# Patient Record
Sex: Male | Born: 1955
Health system: Southern US, Community
[De-identification: ages and names within clinical notes are randomized; demographics above are authoritative.]

## PROBLEM LIST (undated history)

## (undated) DIAGNOSIS — E785 Hyperlipidemia, unspecified: Secondary | ICD-10-CM

## (undated) DIAGNOSIS — Z720 Tobacco use: Secondary | ICD-10-CM

## (undated) DIAGNOSIS — I10 Essential (primary) hypertension: Secondary | ICD-10-CM

## (undated) DIAGNOSIS — I2510 Atherosclerotic heart disease of native coronary artery without angina pectoris: Secondary | ICD-10-CM

## (undated) HISTORY — DX: Hyperlipidemia, unspecified: E78.5

## (undated) HISTORY — DX: Essential (primary) hypertension: I10

## (undated) HISTORY — DX: Atherosclerotic heart disease of native coronary artery without angina pectoris: I25.10

## (undated) HISTORY — DX: Tobacco use: Z72.0

---

## 2006-05-31 ENCOUNTER — Ambulatory Visit (HOSPITAL_COMMUNITY): Admission: RE | Admit: 2006-05-31 | Discharge: 2006-06-01 | Payer: Self-pay | Admitting: Cardiology

## 2006-05-31 HISTORY — PX: CORONARY ANGIOPLASTY WITH STENT PLACEMENT: SHX49

## 2011-06-07 HISTORY — PX: OTHER SURGICAL HISTORY: SHX169

## 2013-01-31 ENCOUNTER — Other Ambulatory Visit (HOSPITAL_COMMUNITY): Payer: Self-pay | Admitting: Cardiovascular Disease

## 2013-01-31 DIAGNOSIS — I25810 Atherosclerosis of coronary artery bypass graft(s) without angina pectoris: Secondary | ICD-10-CM

## 2013-02-04 ENCOUNTER — Ambulatory Visit (HOSPITAL_COMMUNITY)
Admission: RE | Admit: 2013-02-04 | Discharge: 2013-02-04 | Disposition: A | Discharge status: Home / Self Care | Payer: BC Managed Care – PPO | Source: Ambulatory Visit | Attending: Cardiovascular Disease | Admitting: Cardiovascular Disease

## 2013-02-04 DIAGNOSIS — I25810 Atherosclerosis of coronary artery bypass graft(s) without angina pectoris: Secondary | ICD-10-CM | POA: Insufficient documentation

## 2013-02-04 HISTORY — PX: OTHER SURGICAL HISTORY: SHX169

## 2013-02-24 ENCOUNTER — Encounter: Payer: Self-pay | Admitting: *Deleted

## 2013-02-27 ENCOUNTER — Encounter: Payer: Self-pay | Admitting: Cardiovascular Disease

## 2013-02-28 ENCOUNTER — Ambulatory Visit (INDEPENDENT_AMBULATORY_CARE_PROVIDER_SITE_OTHER): Payer: BC Managed Care – PPO | Admitting: Cardiovascular Disease

## 2013-02-28 ENCOUNTER — Encounter: Payer: Self-pay | Admitting: Cardiovascular Disease

## 2013-02-28 VITALS — BP 118/84 | HR 64 | Ht 70.0 in | Wt 191.9 lb

## 2013-02-28 DIAGNOSIS — F17200 Nicotine dependence, unspecified, uncomplicated: Secondary | ICD-10-CM

## 2013-02-28 DIAGNOSIS — R5383 Other fatigue: Secondary | ICD-10-CM

## 2013-02-28 DIAGNOSIS — R5381 Other malaise: Secondary | ICD-10-CM

## 2013-02-28 DIAGNOSIS — I2510 Atherosclerotic heart disease of native coronary artery without angina pectoris: Secondary | ICD-10-CM

## 2013-02-28 DIAGNOSIS — I10 Essential (primary) hypertension: Secondary | ICD-10-CM

## 2013-02-28 DIAGNOSIS — E782 Mixed hyperlipidemia: Secondary | ICD-10-CM

## 2013-02-28 DIAGNOSIS — Z720 Tobacco use: Secondary | ICD-10-CM | POA: Insufficient documentation

## 2013-02-28 DIAGNOSIS — Z79899 Other long term (current) drug therapy: Secondary | ICD-10-CM

## 2013-02-28 DIAGNOSIS — E785 Hyperlipidemia, unspecified: Secondary | ICD-10-CM | POA: Insufficient documentation

## 2013-02-28 LAB — CBC
HCT: 48 % (ref 39.0–52.0)
Hemoglobin: 16.7 g/dL (ref 13.0–17.0)
MCH: 31.9 pg (ref 26.0–34.0)
MCV: 91.6 fL (ref 78.0–100.0)
Platelets: 244 10*3/uL (ref 150–400)
RBC: 5.24 MIL/uL (ref 4.22–5.81)
WBC: 6.8 10*3/uL (ref 4.0–10.5)

## 2013-02-28 LAB — LIPID PANEL
Cholesterol: 221 mg/dL — ABNORMAL HIGH (ref 0–200)
Total CHOL/HDL Ratio: 5.8 Ratio

## 2013-02-28 LAB — COMPREHENSIVE METABOLIC PANEL
ALT: 11 U/L (ref 0–53)
CO2: 25 mEq/L (ref 19–32)
Calcium: 9.4 mg/dL (ref 8.4–10.5)
Chloride: 106 mEq/L (ref 96–112)
Creat: 0.9 mg/dL (ref 0.50–1.35)
Glucose, Bld: 106 mg/dL — ABNORMAL HIGH (ref 70–99)

## 2013-02-28 MED ORDER — ATORVASTATIN CALCIUM 40 MG PO TABS: 40.0000 mg | ORAL_TABLET | Freq: Every day | ORAL | Status: DC

## 2013-02-28 NOTE — Assessment & Plan Note (Signed)
We spent over 10 minutes discussing the critical importance of smoking cessation for his future health. I have told him that we do not know enough about e-cigarettes to recommend them as an alternative, but I think it might be a useful stepping stone to complete smoking cessation. He might be able to even switch to water vapor cartridges since smoking appears to be a habit rather than a nicotine addiction his case.

## 2013-02-28 NOTE — Patient Instructions (Addendum)
Have labs done today.  Restart Atorvastatin Daily  Return in [place] year

## 2013-02-28 NOTE — Assessment & Plan Note (Signed)
He has no symptoms of active coronary insufficiency. She was able to exercise for over 11 minutes on the standardized Bruce protocol which is identical performance to September of 2012. He did not experience chest pain or show evidence of ST segment deviation while in the treadmill.  Unfortunately many of his risk factors are not currently well addressed due to noncompliance. I have reviewed the need for cholesterol-lowering medication and smoking cessation. He will have repeat labs performed today. Of note prior to initiation of statin therapy his LDL cholesterol was over 150, but while on statin therapy his LDL cholesterol was around 80.

## 2013-02-28 NOTE — Assessment & Plan Note (Signed)
Check labs today and resume atorvastatin 40 mg once daily

## 2013-02-28 NOTE — Assessment & Plan Note (Signed)
He does not truly appear to have hypotension. When relaxed his blood pressure is quite normal off medication. We will not restart either lisinopril or beta blockers. It is unclear which one of these medications was causing headaches.

## 2013-02-28 NOTE — Progress Notes (Signed)
Patient ID: Jose Bright, male   DOB: October 09, 1955, 57 y.o.   MRN: [id number]   Reason for office visit  This is Jose Bright first office visit since September 2012. He has a history of progressive angina pectoris eventually leading to cardiac catheterization in 2007. At that time he had a high-grade mid LAD stenosis dose treatment for placement of a drug-eluting stent. It is also noted to have a 20% ostial left main stenosis, a left circumflex coronary free of lesions, a 30% mid RCA stenosis and40% stenosis in a large posterior lateral ventricular branch of the right coronary artery.  He has not had a new coronary event since that time. Unfortunately he continues to smoke at least three quarters of a pack of cigarettes a day. 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  He developed headaches and stopped taking all his antihypertensive medications. The headaches resolved. His blood pressure has been normal when he checks it while relaxed and his blood pressure is normal today. He was also normal at the beginning of the stress test a few days ago, but he did have a hypertensive response to exercise.  He has also stopped taking his statin.   No Known Allergies  Current Outpatient Prescriptions  Medication Sig Dispense Refill  . nitroGLYCERIN (NITROSTAT) 0.4 MG SL tablet Place 0.4 mg under the tongue every 5 (five) minutes as needed for chest pain.      Marland Kitchen aspirin EC 81 MG tablet Take 81 mg by mouth daily. Take 2 tablets daily      . atorvastatin (LIPITOR) 40 MG tablet Take 1 tablet (40 mg total) by mouth daily.  90 tablet  3  . fish oil-omega-3 fatty acids 1000 MG capsule Take 2 g by mouth daily.       No current facility-administered medications for this visit.    Past Medical History  Diagnosis Date  . CAD (coronary artery disease)   . Systemic hypertension   . Hyperlipidemia   . Tobacco abuse     Past  Surgical History  Procedure Laterality Date  . Coronary angioplasty with stent placement  05/31/2006    drug eluting stent LAD  . Exercise treadmill test  02/04/2013    No significant ischemia  . Nuclear stress test  06/07/2011    No ischemia    No family history on file.  History   Social History  . Marital Status: Divorced    Spouse Name: N/A    Number of Children: N/A  . Years of Education: N/A   Occupational History  . Not on file.   Social History Main Topics  . Smoking status: Current Every Day Smoker -- 0.75 packs/day    Types: Cigarettes  . Smokeless tobacco: Not on file  . Alcohol Use: 3.5 - 7 oz/week    7-14 drink(s) per week  . Drug Use: No  . Sexually Active: Not on file   Other Topics Concern  . Not on file   Social History Narrative  . No narrative on file    Review of systems: The patient specifically denies any chest pain at rest exertion, dyspnea at rest or with exertion, orthopnea, paroxysmal nocturnal dyspnea, syncope, palpitations, focal neurological deficits, intermittent claudication, lower extremity edema, unexplained weight gain, cough, hemoptysis or wheezing. He denies dysuria hematuria, frequency, urgency, polyuria, polydipsia, intolerance to heat or cold, abdominal pain, change in bowel habits, gastrointestinal  bleeding, arthralgias, muscle weakness, intolerance to heat or cold, allergic reactions. He has mild erectile dysfunction and has not noticed any benefit from Viagra.  PHYSICAL EXAM BP 118/84  Pulse 64  Ht 5\' 10"  (1.778 m)  Wt 191 lb 14.4 oz (87.045 kg)  BMI 27.53 kg/m2  General: Alert, oriented x3, no distress Head: no evidence of trauma, PERRL, EOMI, no exophtalmos or lid lag, no myxedema, no xanthelasma; normal ears, nose and oropharynx Neck: normal jugular venous pulsations and no hepatojugular reflux; brisk carotid pulses without delay and no carotid bruits Chest: clear to auscultation, no signs of consolidation by percussion or  palpation, normal fremitus, symmetrical and full respiratory excursions Cardiovascular: normal position and quality of the apical impulse, regular rhythm, normal first and second heart sounds, no murmurs, rubs or gallops Abdomen: no tenderness or distention, no masses by palpation, no abnormal pulsatility or arterial bruits, normal bowel sounds, no hepatosplenomegaly Extremities: no clubbing, cyanosis or edema; 2+ radial, ulnar and brachial pulses bilaterally; 2+ right femoral, posterior tibial and dorsalis pedis pulses; 2+ left femoral, posterior tibial and dorsalis pedis pulses; no subclavian or femoral bruits Neurological: grossly nonfocal   EKG: Normal sinus rhythm normal tracing    Lipid Panel  No results found for this basename: chol, trig, hdl, cholhdl, vldl, ldlcalc    BMET No results found for this basename: na, k, cl, co2, glucose, bun, creatinine, calcium, gfrnonaa, gfraa     ASSESSMENT AND PLAN  CAD (coronary artery disease) He has no symptoms of active coronary insufficiency. She was able to exercise for over 11 minutes on the standardized Bruce protocol which is identical performance to September of 2012. He did not experience chest pain or show evidence of ST segment deviation while in the treadmill.  Unfortunately many of his risk factors are not currently well addressed due to noncompliance. I have reviewed the need for cholesterol-lowering medication and smoking cessation. He will have repeat labs performed today. Of note prior to initiation of statin therapy his LDL cholesterol was over 150, but while on statin therapy his LDL cholesterol was around 80.  Systemic hypertension He does not truly appear to have hypotension. When relaxed his blood pressure is quite normal off medication. We will not restart either lisinopril or beta blockers. It is unclear which one of these medications was causing headaches.  Hyperlipidemia Check labs today and resume atorvastatin 40 mg  once daily  Tobacco abuse We spent over 10 minutes discussing the critical importance of smoking cessation for his future health. I have told him that we do not know enough about e-cigarettes to recommend them as an alternative, but I think it might be a useful stepping stone to complete smoking cessation. He might be able to even switch to water vapor cartridges since smoking appears to be a habit rather than a nicotine addiction his case.     Junious Silk, MD, [facility] [phone] office [phone] pager

## 2014-02-11 ENCOUNTER — Encounter: Payer: Self-pay | Admitting: Cardiovascular Disease

## 2014-02-11 ENCOUNTER — Ambulatory Visit (INDEPENDENT_AMBULATORY_CARE_PROVIDER_SITE_OTHER): Payer: BC Managed Care – PPO | Admitting: Cardiovascular Disease

## 2014-02-11 VITALS — BP 120/64 | HR 66 | Resp 16 | Ht 70.5 in | Wt 193.4 lb

## 2014-02-11 DIAGNOSIS — F17200 Nicotine dependence, unspecified, uncomplicated: Secondary | ICD-10-CM

## 2014-02-11 DIAGNOSIS — Z720 Tobacco use: Secondary | ICD-10-CM

## 2014-02-11 DIAGNOSIS — Z79899 Other long term (current) drug therapy: Secondary | ICD-10-CM | POA: Diagnosis not present

## 2014-02-11 DIAGNOSIS — E785 Hyperlipidemia, unspecified: Secondary | ICD-10-CM | POA: Diagnosis not present

## 2014-02-11 DIAGNOSIS — I2510 Atherosclerotic heart disease of native coronary artery without angina pectoris: Secondary | ICD-10-CM | POA: Diagnosis not present

## 2014-02-11 DIAGNOSIS — I10 Essential (primary) hypertension: Secondary | ICD-10-CM

## 2014-02-11 LAB — COMPREHENSIVE METABOLIC PANEL
ALT: 16 U/L (ref 0–53)
AST: 15 U/L (ref 0–37)
Albumin: 4.2 g/dL (ref 3.5–5.2)
Alkaline Phosphatase: 41 U/L (ref 39–117)
BILIRUBIN TOTAL: 1 mg/dL (ref 0.2–1.2)
BUN: 13 mg/dL (ref 6–23)
CO2: 24 meq/L (ref 19–32)
Calcium: 9.2 mg/dL (ref 8.4–10.5)
Chloride: 105 mEq/L (ref 96–112)
Creat: 0.89 mg/dL (ref 0.50–1.35)
Glucose, Bld: 106 mg/dL — ABNORMAL HIGH (ref 70–99)
Potassium: 4.6 mEq/L (ref 3.5–5.3)
SODIUM: 140 meq/L (ref 135–145)
TOTAL PROTEIN: 6.1 g/dL (ref 6.0–8.3)

## 2014-02-11 LAB — LIPID PANEL
Cholesterol: 139 mg/dL (ref 0–200)
HDL: 35 mg/dL — AB (ref >39)
LDL CALC: 82 mg/dL (ref 0–99)
Total CHOL/HDL Ratio: 4 Ratio
Triglycerides: 109 mg/dL (ref <150)
VLDL: 22 mg/dL (ref 0–40)

## 2014-02-11 MED ORDER — LISINOPRIL 20 MG PO TABS: 20.0000 mg | ORAL_TABLET | Freq: Every day | ORAL | Status: DC

## 2014-02-11 NOTE — Patient Instructions (Signed)
Your physician recommends that you return for lab work in: [facility].  DOT requires a Treadmill every 2 years.  Dr. Royann Shiversroitoru recommends that you schedule a follow-up appointment in: ONE YEAR.

## 2014-02-11 NOTE — Progress Notes (Signed)
Patient ID: Jose Bright, male   DOB: Sep 20, 1956, 58 y.o.   MRN: [id number]      Reason for office visit CAD, tobacco abuse, hyperlipidemia  This is a yearlly visit. He had stopped taking his BP meds, but noticed that his SBP was around 160. He restarted lisinopril and BP is now normal. He has been compliant with statin therapy. He denies angina or dyspnea. He continues to smoke, but says he has cut back (when we counted how much he smokes, it hasn't really changed - 3/4 ppd).  He has a history of progressive angina pectoris eventually leading to cardiac catheterization in 2007. At that time he had a high-grade mid LAD stenosis dose treatment for placement of a drug-eluting stent. It is also noted to have a 20% ostial left main stenosis, a left circumflex coronary free of lesions, a 30% mid RCA stenosis and40% stenosis in a large posterior lateral ventricular branch of the right coronary artery. He has not had a new coronary event since that time. 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 He exercised for >11 minutes, just like at his nuclear scan in 2012. Both studies were normal.   No Known Allergies  Current Outpatient Prescriptions  Medication Sig Dispense Refill  . aspirin EC 81 MG tablet Take 81 mg by mouth daily. Take 2 tablets daily      . atorvastatin (LIPITOR) 40 MG tablet Take 1 tablet (40 mg total) by mouth daily.  90 tablet  3  . fish oil-omega-3 fatty acids 1000 MG capsule Take 2 g by mouth daily.      Marland Kitchen lisinopril (PRINIVIL,ZESTRIL) 20 MG tablet Take 1 tablet (20 mg total) by mouth daily.  90 tablet  3  . nitroGLYCERIN (NITROSTAT) 0.4 MG SL tablet Place 0.4 mg under the tongue every 5 (five) minutes as needed for chest pain.       No current facility-administered medications for this visit.    Past Medical History  Diagnosis Date  . CAD (coronary artery disease)   . Systemic hypertension   . Hyperlipidemia   .  Tobacco abuse     Past Surgical History  Procedure Laterality Date  . Coronary angioplasty with stent placement  05/31/2006    drug eluting stent LAD  . Exercise treadmill test  02/04/2013    No significant ischemia  . Nuclear stress test  06/07/2011    No ischemia    No family history on file.  History   Social History  . Marital Status: Divorced    Spouse Name: N/A    Number of Children: N/A  . Years of Education: N/A   Occupational History  . Not on file.   Social History Main Topics  . Smoking status: Current Every Day Smoker -- 0.75 packs/day    Types: Cigarettes  . Smokeless tobacco: Not on file  . Alcohol Use: 3.5 - 7.0 oz/week    7-14 drink(s) per week  . Drug Use: No  . Sexual Activity: Not on file   Other Topics Concern  . Not on file   Social History Narrative  . No narrative on file    Review of systems: The patient specifically denies any chest pain at rest or with exertion, dyspnea at rest or with exertion, orthopnea, paroxysmal nocturnal dyspnea, syncope, palpitations, focal neurological deficits, intermittent claudication, lower extremity edema, unexplained weight gain, cough, hemoptysis or wheezing.  The patient also  denies abdominal pain, nausea, vomiting, dysphagia, diarrhea, constipation, polyuria, polydipsia, dysuria, hematuria, frequency, urgency, abnormal bleeding or bruising, fever, chills, unexpected weight changes, mood swings, change in skin or hair texture, change in voice quality, auditory or visual problems, allergic reactions or rashes, new musculoskeletal complaints other than usual "aches and pains".  PHYSICAL EXAM BP 120/64  Pulse 66  Resp 16  Ht 5' 10.5" (1.791 m)  Wt 193 lb 6.4 oz (87.726 kg)  BMI 27.35 kg/m2  General: Alert, oriented x3, no distress Head: no evidence of trauma, PERRL, EOMI, no exophtalmos or lid lag, no myxedema, no xanthelasma; normal ears, nose and oropharynx Neck: normal jugular venous pulsations and no  hepatojugular reflux; brisk carotid pulses without delay and no carotid bruits Chest: clear to auscultation, no signs of consolidation by percussion or palpation, normal fremitus, symmetrical and full respiratory excursions Cardiovascular: normal position and quality of the apical impulse, regular rhythm, normal first and second heart sounds, no murmurs, rubs or gallops Abdomen: no tenderness or distention, no masses by palpation, no abnormal pulsatility or arterial bruits, normal bowel sounds, no hepatosplenomegaly Extremities: no clubbing, cyanosis or edema; 2+ radial, ulnar and brachial pulses bilaterally; 2+ right femoral, posterior tibial and dorsalis pedis pulses; 2+ left femoral, posterior tibial and dorsalis pedis pulses; no subclavian or femoral bruits Neurological: grossly nonfocal  EKG: NSR, normal  Lipid Panel     Component Value Date/Time   CHOL 139 02/11/2014 0905   TRIG 109 02/11/2014 0905   HDL 35* 02/11/2014 0905   CHOLHDL 4.0 02/11/2014 0905   VLDL 22 02/11/2014 0905   LDLCALC 82 02/11/2014 0905    BMET    Component Value Date/Time   NA 140 02/11/2014 0905   K 4.6 02/11/2014 0905   CL 105 02/11/2014 0905   CO2 24 02/11/2014 0905   GLUCOSE 106* 02/11/2014 0905   BUN 13 02/11/2014 0905   CREATININE 0.89 02/11/2014 0905   CALCIUM 9.2 02/11/2014 0905     ASSESSMENT AND PLAN  CAD (coronary artery disease)  He has no symptoms of active coronary insufficiency. H was able to exercise for over 11 minutes on the standardized Bruce protocol in 2014, identicalal performance to September of 2012.  Systemic hypertension  Back on lisinopril (which he had blamed for headaches in the past), BP excellent.   Hyperlipidemia  Checked labs today on atorvastatin 40 mg once daily. Satisfactory LDL. Ideally would be <70, but do not think adding Zetia is likely to make a difference. Reviewed a cardiac healthy diet. Exercise will help HDL.   Tobacco abuse  We spent over 10 minutes discussing  the critical importance of smoking cessation for his future health - went over exactly the same issues as last year. I have told him that we do not know enough about e-cigarettes to recommend them as an alternative, but I think it might be a useful stepping stone to complete smoking cessation. He might be able to even switch to water vapor cartridges since smoking appears to be a habit rather than a nicotine addiction his case.   Orders Placed This Encounter  Procedures  . Lipid Profile  . Comp Met (CMET)  . EKG 12-Lead   Meds ordered this encounter  Medications  . DISCONTD: lisinopril (PRINIVIL,ZESTRIL) 20 MG tablet    Sig: Take 20 mg by mouth daily.  Marland Kitchen lisinopril (PRINIVIL,ZESTRIL) 20 MG tablet    Sig: Take 1 tablet (20 mg total) by mouth daily.    Dispense:  90 tablet  Refill:  3    Monte Zinni  Sanda Klein, MD, [facility] [phone] office [phone] pager

## 2014-05-17 ENCOUNTER — Other Ambulatory Visit: Payer: Self-pay | Admitting: Cardiovascular Disease

## 2014-05-19 NOTE — Telephone Encounter (Signed)
Rx refill denied to patient pharmacy, Rx was filled 5/29

## 2014-06-17 ENCOUNTER — Other Ambulatory Visit: Payer: Self-pay | Admitting: Cardiovascular Disease

## 2014-06-18 NOTE — Telephone Encounter (Signed)
Rx was sent to pharmacy electronically. 

## 2014-09-14 ENCOUNTER — Ambulatory Visit (INDEPENDENT_AMBULATORY_CARE_PROVIDER_SITE_OTHER): Payer: BC Managed Care – PPO | Admitting: Family Medicine

## 2014-09-14 VITALS — BP 132/72 | HR 74 | Temp 97.6°F | Resp 16 | Ht 71.0 in | Wt 185.0 lb

## 2014-09-14 DIAGNOSIS — J0100 Acute maxillary sinusitis, unspecified: Secondary | ICD-10-CM

## 2014-09-14 MED ORDER — AMOXICILLIN-POT CLAVULANATE 875-125 MG PO TABS: 1.0000 | ORAL_TABLET | Freq: Two times a day (BID) | ORAL | Status: DC

## 2014-09-14 MED ORDER — IPRATROPIUM BROMIDE 0.02 % IN SOLN
0.5000 mg | Freq: Once | RESPIRATORY_TRACT | Status: AC
  Administered 2014-09-14: 0.5 mg via RESPIRATORY_TRACT

## 2014-09-14 MED ORDER — HYDROCODONE-ACETAMINOPHEN 7.5-325 MG/15ML PO SOLN: 5.0000 mL | ORAL | Status: DC | PRN

## 2014-09-14 MED ORDER — ALBUTEROL SULFATE (2.5 MG/3ML) 0.083% IN NEBU
2.5000 mg | INHALATION_SOLUTION | Freq: Once | RESPIRATORY_TRACT | Status: AC
  Administered 2014-09-14: 2.5 mg via RESPIRATORY_TRACT

## 2014-09-14 MED ORDER — PREDNISONE 20 MG PO TABS: ORAL_TABLET | ORAL | Status: DC

## 2014-09-14 MED ORDER — MUPIROCIN 2 % EX OINT: 1.0000 "application " | TOPICAL_OINTMENT | Freq: Three times a day (TID) | CUTANEOUS | Status: DC

## 2014-09-14 NOTE — Progress Notes (Signed)
Subjective:  This chart was scribed for Norberto SorensonEva Kymoni Monday, MD by Haywood PaoNadim Abu Hashem, ED Scribe at [facility].The patient was seen in exam room 14 and the patient's care was started at 3:38 PM.   Patient ID: Jose Bright, male    DOB: 10/04/1955, 58 y.o.   MRN: [id number] Chief Complaint  Patient presents with  . Sinusitis   HPI  HPI Comments: Jose CapesDwight Bright is a 58 y.o. male who [facility] complaining [facility] HA, sinus infection onset 3 weeks ago. Pt has HA's primarily on the right side of his head at night . The pain radiates to his right ear and jaw. Pt has clear nasal discharge and trouble sleeping as associated symptoms. He has a productive cough but states this is because of smoking.  Pt was treated two years ago march for a sinus infection and he states he had similar complaints.  Past Medical History  Diagnosis Date  . CAD (coronary artery disease)   . Systemic hypertension   . Hyperlipidemia   . Tobacco abuse    Current Outpatient Prescriptions on File Prior to Visit  Medication Sig Dispense Refill  . aspirin EC 81 MG tablet Take 81 mg by mouth daily. Take 2 tablets daily    . atorvastatin (LIPITOR) 40 MG tablet TAKE 1 TABLET (40 MG TOTAL) BY MOUTH DAILY. 90 tablet 2  . nitroGLYCERIN (NITROSTAT) 0.4 MG SL tablet Place 0.4 mg under the tongue every 5 (five) minutes as needed for chest pain.    . fish oil-omega-3 fatty acids 1000 MG capsule Take 2 g by mouth daily.    Marland Kitchen. lisinopril (PRINIVIL,ZESTRIL) 20 MG tablet Take 1 tablet (20 mg total) by mouth daily. (Patient not taking: Reported on 09/14/2014) 90 tablet 3   No current facility-administered medications on file prior to visit.  No Known Allergies  Review of Systems  HENT: Positive for congestion, ear pain, rhinorrhea and sinus pressure.   Respiratory: Positive for cough.   Neurological: Positive for headaches.  Psychiatric/Behavioral: Positive for sleep disturbance.      Objective:  BP 132/72 mmHg  Pulse  74  Temp(Src) 97.6 F (36.4 C) (Oral)  Resp 16  Ht 5\' 11"  (1.803 m)  Wt 185 lb (83.915 kg)  BMI 25.81 kg/m2  SpO2 97%  Physical Exam  Constitutional: He is oriented to person, place, and time. He appears well-developed and well-nourished.  HENT:  Head: Normocephalic and atraumatic.  Mouth/Throat: Mucous membranes are normal. Posterior oropharyngeal erythema present. No oropharyngeal exudate or posterior oropharyngeal edema.  Cerumen impaction bilaterally but left is more clear than right.  Eyes: EOM are normal.  Neck: Normal range of motion.  Cardiovascular: Normal rate, regular rhythm, S1 normal and normal heart sounds.   No murmur heard. Pulmonary/Chest: Effort normal.  Left lower lobe rhonchi on expiration and inspiration. Right lower lobe rhonchi on inspiration but cleared with prolonged expiration.    Musculoskeletal: Normal range of motion.  Lymphadenopathy:       Head (right side): Tonsillar adenopathy present.       Head (left side): Tonsillar adenopathy present.    He has no cervical adenopathy.       Right: No supraclavicular adenopathy present.       Left: No supraclavicular adenopathy present.  Neurological: He is alert and oriented to person, place, and time.  Skin: Skin is warm and dry.  Psychiatric: He has a normal mood and affect. His behavior is normal.  Nursing note and  vitals reviewed.     Assessment & Plan:   Acute maxillary sinusitis, recurrence not specified - Plan: albuterol (PROVENTIL) (2.5 MG/3ML) 0.083% nebulizer solution 2.5 mg, ipratropium (ATROVENT) nebulizer solution 0.5 mg  Meds ordered this encounter  Medications  . DISCONTD: amoxicillin-clavulanate (AUGMENTIN) 875-125 MG per tablet    Sig: Take 1 tablet by mouth 2 (two) times daily.    Dispense:  28 tablet    Refill:  0  . DISCONTD: predniSONE (DELTASONE) 20 MG tablet    Sig: Take 3 tabs po qd x 3d, then 2 tabs po qd x 3d, then 1 tab po qd x 3d, then stop    Dispense:  18 tablet     Refill:  0  . albuterol (PROVENTIL) (2.5 MG/3ML) 0.083% nebulizer solution 2.5 mg    Sig:   . ipratropium (ATROVENT) nebulizer solution 0.5 mg    Sig:   . mupirocin ointment (BACTROBAN) 2 %    Sig: Apply 1 application topically 3 (three) times daily.    Dispense:  30 g    Refill:  1  . HYDROcodone-acetaminophen (HYCET) 7.5-325 mg/15 ml solution    Sig: Take 5-10 mLs by mouth every 4 (four) hours as needed for moderate pain.    Dispense:  140 mL    Refill:  0    I personally performed the services described in this documentation, which was scribed in my presence. The recorded information has been reviewed and considered, and addended by me as needed.  Norberto SorensonEva Rhodia [place], MD MPH

## 2014-09-14 NOTE — Patient Instructions (Signed)
Hot showers or breathing in steam may help loosen the congestion.  Using a netti pot or sinus rinse is also likely to help you feel better and keep this from progressing.   I recommend augmenting with generic mucinex to help you move out the congestion.  If no improvement or you are getting worse, come back but hopefully with all of the above, you can avoid it.   Sinusitis Sinusitis is redness, soreness, and inflammation of the paranasal sinuses. Paranasal sinuses are air pockets within the bones of your face (beneath the eyes, the middle of the forehead, or above the eyes). In healthy paranasal sinuses, mucus is able to drain out, and air is able to circulate through them by way of your nose. However, when your paranasal sinuses are inflamed, mucus and air can become trapped. This can allow bacteria and other germs to grow and cause infection. Sinusitis can develop quickly and last only a short time (acute) or continue over a long period (chronic). Sinusitis that lasts for more than 12 weeks is considered chronic.  CAUSES  Causes of sinusitis include:  Allergies.  Structural abnormalities, such as displacement of the cartilage that separates your nostrils (deviated septum), which can decrease the air flow through your nose and sinuses and affect sinus drainage.  Functional abnormalities, such as when the small hairs (cilia) that line your sinuses and help remove mucus do not work properly or are not present. SIGNS AND SYMPTOMS  Symptoms of acute and chronic sinusitis are the same. The primary symptoms are pain and pressure around the affected sinuses. Other symptoms include:  Upper toothache.  Earache.  Headache.  Bad breath.  Decreased sense of smell and taste.  A cough, which worsens when you are lying flat.  Fatigue.  Fever.  Thick drainage from your nose, which often is green and may contain pus (purulent).  Swelling and warmth over the affected sinuses. DIAGNOSIS  Your  health care provider will perform a physical exam. During the exam, your health care provider may:  Look in your nose for signs of abnormal growths in your nostrils (nasal polyps).  Tap over the affected sinus to check for signs of infection.  View the inside of your sinuses (endoscopy) using an imaging device that has a light attached (endoscope). If your health care provider suspects that you have chronic sinusitis, one or more of the following tests may be recommended:  Allergy tests.  Nasal culture. A sample of mucus is taken from your nose, sent to a lab, and screened for bacteria.  Nasal cytology. A sample of mucus is taken from your nose and examined by your health care provider to determine if your sinusitis is related to an allergy. TREATMENT  Most cases of acute sinusitis are related to a viral infection and will resolve on their own within 10 days. Sometimes medicines are prescribed to help relieve symptoms (pain medicine, decongestants, nasal steroid sprays, or saline sprays).  However, for sinusitis related to a bacterial infection, your health care provider will prescribe antibiotic medicines. These are medicines that will help kill the bacteria causing the infection.  Rarely, sinusitis is caused by a fungal infection. In theses cases, your health care provider will prescribe antifungal medicine. For some cases of chronic sinusitis, surgery is needed. Generally, these are cases in which sinusitis recurs more than 3 times per year, despite other treatments. HOME CARE INSTRUCTIONS   Drink plenty of water. Water helps thin the mucus so your sinuses can drain more   easily.  Use a humidifier.  Inhale steam 3 to 4 times a day (for example, sit in the bathroom with the shower running).  Apply a warm, moist washcloth to your face 3 to 4 times a day, or as directed by your health care provider.  Use saline nasal sprays to help moisten and clean your sinuses.  Take medicines only as  directed by your health care provider.  If you were prescribed either an antibiotic or antifungal medicine, finish it all even if you start to feel better. SEEK IMMEDIATE MEDICAL CARE IF:  You have increasing pain or severe headaches.  You have nausea, vomiting, or drowsiness.  You have swelling around your face.  You have vision problems.  You have a stiff neck.  You have difficulty breathing. MAKE SURE YOU:   Understand these instructions.  Will watch your condition.  Will get help right away if you are not doing well or get worse. Document Released: 09/19/2005 Document Revised: 02/03/2014 Document Reviewed: 10/04/2011 Exit[employer] Patient Information 2015 [employer]. This information is not intended to replace advice given to you by your health care provider. Make sure you discuss any questions you have with your health care provider.  

## 2014-09-27 ENCOUNTER — Other Ambulatory Visit: Payer: Self-pay | Admitting: Family Medicine

## 2014-09-28 ENCOUNTER — Telehealth: Payer: Self-pay

## 2014-09-28 NOTE — Telephone Encounter (Signed)
Pt called in stating that he needed a refill on his amoxicillin-clavulanate (AUGMENTIN) 875-125 MG per tablet. He uses the [facility] in [place], [place] - [address]  His call back number is [phone]

## 2014-09-29 ENCOUNTER — Ambulatory Visit (INDEPENDENT_AMBULATORY_CARE_PROVIDER_SITE_OTHER): Payer: BC Managed Care – PPO | Admitting: Family Medicine

## 2014-09-29 VITALS — BP 140/90 | HR 60 | Temp 98.0°F | Resp 16 | Ht 70.5 in | Wt 185.0 lb

## 2014-09-29 DIAGNOSIS — J0111 Acute recurrent frontal sinusitis: Secondary | ICD-10-CM

## 2014-09-29 DIAGNOSIS — J3089 Other allergic rhinitis: Secondary | ICD-10-CM

## 2014-09-29 MED ORDER — AMOXICILLIN-POT CLAVULANATE 875-125 MG PO TABS: 1.0000 | ORAL_TABLET | Freq: Two times a day (BID) | ORAL | Status: DC

## 2014-09-29 MED ORDER — PREDNISONE 20 MG PO TABS: ORAL_TABLET | ORAL | Status: DC

## 2014-09-29 NOTE — Patient Instructions (Signed)

## 2014-09-29 NOTE — Progress Notes (Signed)
Subjective:    Patient ID: Jose Bright, male    DOB: 11/28/1955, 58 y.o.   MRN: [id number] This chart was scribed for Ethelda ChickKristi M Ruqaya Strauss, MD by Leona CarryG. Clay Sherrill, ED Scribe. The patient was seen in 8. The patient's care was started at 7:38 PM.   09/29/2014  Sinus Problem  HPI  HPI Comments: Jose Bright is a 58 y.o. male who presents today for a two week follow up for sinusitis. He reports continued rhinorrhea with clear drainage. Patient states that his cough is still present but has improved in the last week. He was prescribed Augmentin and Prednisone and states that he has taken them as directed. Patient has also taken OTC Flonase with some relief of symptoms. Patient reports some improvement beginning several days before Christmas. He reports less frequent headaches and reduced sinus pain and burning. Patient reports that his headaches can be so severe that they cause his eyes to water and nose to run. He denies fever, chills, sore throat, otalgia, or SOB in the last week.  He has been using Bactroban mixed in saline to spray into nose per recommendation of pharmacist.  Similar sinus symptoms several years ago; underwent ENT consultation at that time due to severity of headaches; diagnosed with sinus infection at that time.   Symptoms are 75% improved but feels that warrants additional medication to completely treat symptoms; Dr. Clelia CroftShaw prescribed two weeks of antibiotics.  Patient does not have a PCP other than [facility]. Patient has a history of smoking and states that he smokes less than one pack per day.   Review of Systems  Constitutional: Negative for fever, chills, diaphoresis and fatigue.  HENT: Positive for congestion, postnasal drip, rhinorrhea and sinus pressure. Negative for dental problem, ear discharge, ear pain, sneezing, sore throat, trouble swallowing and voice change.   Respiratory: Positive for cough. Negative for shortness of breath.   Gastrointestinal: Negative for nausea,  vomiting and diarrhea.  Skin: Negative for rash.  Neurological: Positive for headaches. Negative for dizziness, tremors, seizures, syncope, speech difficulty, light-headedness and numbness.    Past Medical History  Diagnosis Date  . CAD (coronary artery disease)   . Systemic hypertension   . Hyperlipidemia   . Tobacco abuse    Past Surgical History  Procedure Laterality Date  . Coronary angioplasty with stent placement  05/31/2006    drug eluting stent LAD  . Exercise treadmill test  02/04/2013    No significant ischemia  . Nuclear stress test  06/07/2011    No ischemia   No Known Allergies Current Outpatient Prescriptions  Medication Sig Dispense Refill  . aspirin EC 81 MG tablet Take 81 mg by mouth daily. Take 2 tablets daily    . atorvastatin (LIPITOR) 40 MG tablet TAKE 1 TABLET (40 MG TOTAL) BY MOUTH DAILY. 90 tablet 2  . fish oil-omega-3 fatty acids 1000 MG capsule Take 2 g by mouth daily.    Marland Kitchen. HYDROcodone-acetaminophen (HYCET) 7.5-325 mg/15 ml solution Take 5-10 mLs by mouth every 4 (four) hours as needed for moderate pain. 140 mL 0  . lisinopril (PRINIVIL,ZESTRIL) 20 MG tablet Take 1 tablet (20 mg total) by mouth daily. 90 tablet 3  . mupirocin ointment (BACTROBAN) 2 % Apply 1 application topically 3 (three) times daily. 30 g 1  . nitroGLYCERIN (NITROSTAT) 0.4 MG SL tablet Place 0.4 mg under the tongue every 5 (five) minutes as needed for chest pain.    . predniSONE (DELTASONE) 20 MG tablet 2 tabs po  qd x 3d, then 1 tab po qd x 3d, then stop 9 tablet 0  . amoxicillin-clavulanate (AUGMENTIN) 875-125 MG per tablet Take 1 tablet by mouth 2 (two) times daily. 20 tablet 0   No current facility-administered medications for this visit.       Objective:    Triage Vitals: BP 140/90 mmHg  Pulse 60  Temp(Src) 98 F (36.7 C) (Oral)  Resp 16  Ht 5' 10.5" (1.791 m)  Wt 185 lb (83.915 kg)  BMI 26.16 kg/m2  SpO2 99% Physical Exam  Constitutional: He is oriented to person, place,  and time. He appears well-developed and well-nourished. No distress.  HENT:  Head: Normocephalic and atraumatic.  Right Ear: External ear normal.  Left Ear: External ear normal.  Nose: Mucosal edema and rhinorrhea present. No sinus tenderness or nasal deformity. Right sinus exhibits maxillary sinus tenderness and frontal sinus tenderness. Left sinus exhibits no maxillary sinus tenderness and no frontal sinus tenderness.  Mouth/Throat: No oropharyngeal exudate.  Diffuse erythema in bilateral nares. Drainage in oropharynx.  Eyes: Conjunctivae and EOM are normal.  Neck: Neck supple. No tracheal deviation present. No thyromegaly present.  Cardiovascular: Normal rate, regular rhythm and normal heart sounds.   No murmur heard. Pulmonary/Chest: Effort normal. No respiratory distress. He has wheezes. He has no rales.  Scant expiratory wheezes throughout.  Musculoskeletal: Normal range of motion.  Lymphadenopathy:    He has no cervical adenopathy.  Neurological: He is alert and oriented to person, place, and time.  Skin: Skin is warm and dry. He is not diaphoretic.  Psychiatric: He has a normal mood and affect. His behavior is normal.  Nursing note and vitals reviewed.       Assessment & Plan:   1. Acute recurrent frontal sinusitis   2. Other allergic rhinitis      1. Acute frontal sinusitis: improving but persistent; refill of Augmentin provided. Also refill of Prednisone provided due to severity of symptoms with initial presentation. 2.  Allergic Rhinitis: uncontrolled; recommend pt take daily Flonase; rx for Prednisone provided.    Meds ordered this encounter  Medications  . amoxicillin-clavulanate (AUGMENTIN) 875-125 MG per tablet    Sig: Take 1 tablet by mouth 2 (two) times daily.    Dispense:  20 tablet    Refill:  0  . predniSONE (DELTASONE) 20 MG tablet    Sig: 2 tabs po qd x 3d, then 1 tab po qd x 3d, then stop    Dispense:  9 tablet    Refill:  0    No Follow-up on  file.   I personally performed the services described in this documentation, which was scribed in my presence. The recorded information has been reviewed and considered.    Nilda SimmerKristi Pearse Shiffler, M.D.  [facility]  [facility] [address] [place], [place]  [id number] [phone] phone [phone] fax

## 2014-09-30 NOTE — Telephone Encounter (Signed)
Pt needs to RTC- we will not refill abx prescriptions without a re evaluation.

## 2014-09-30 NOTE — Telephone Encounter (Signed)
Pt was seen on 12/27 and refill was sent to the pharmacy.

## 2014-10-19 ENCOUNTER — Ambulatory Visit (HOSPITAL_COMMUNITY): Admission: RE | Admit: 2014-10-19 | Payer: Self-pay | Source: Ambulatory Visit

## 2014-10-19 ENCOUNTER — Telehealth: Payer: Self-pay | Admitting: *Deleted

## 2014-10-19 ENCOUNTER — Ambulatory Visit (INDEPENDENT_AMBULATORY_CARE_PROVIDER_SITE_OTHER): Payer: BLUE CROSS/BLUE SHIELD | Admitting: Family Medicine

## 2014-10-19 VITALS — BP 128/74 | HR 70 | Temp 97.9°F | Resp 19 | Ht 70.0 in | Wt 185.8 lb

## 2014-10-19 DIAGNOSIS — J329 Chronic sinusitis, unspecified: Secondary | ICD-10-CM

## 2014-10-19 DIAGNOSIS — H04201 Unspecified epiphora, right lacrimal gland: Secondary | ICD-10-CM

## 2014-10-19 DIAGNOSIS — R51 Headache: Secondary | ICD-10-CM

## 2014-10-19 DIAGNOSIS — J3489 Other specified disorders of nose and nasal sinuses: Secondary | ICD-10-CM

## 2014-10-19 DIAGNOSIS — R519 Headache, unspecified: Secondary | ICD-10-CM

## 2014-10-19 LAB — POCT SEDIMENTATION RATE: POCT SED RATE: 21 mm/h (ref 0–22)

## 2014-10-19 NOTE — Progress Notes (Addendum)
Subjective:    Patient ID: Jose Bright, male    DOB: [birth year], 59 y.o.   MRN: [id number] This chart was scribed for Merri Ray, MD by Marti Sleigh, Medical Scribe. This patient was seen in Room 2 and the patient's care was started a 3:10 PM.  Chief Complaint  Patient presents with  . Follow-up    patient has been here twice for sinus, getting better but has not gone away  . Headache    right side headache  . Nasal Congestion    HPI HPI Comments: Jose Bright is a 59 y.o. male with a hx of CAD, HTN and HLD who presents to [facility] reporting for a follow up appointment due to recurrent sinus infection over the last two months. Pt states he has improved but endorses persistent sharp and burning HA on right side, with associated soreness on the right side of face radiating across his scalp with associated eye watering. Pt states he had similar sx two years ago. Pt states he is blowing out green mucous, with some blood for the last week. Pt states that he has been using a Q-tip to manually open his sinuses, and apply Bactroban. After he used the q-tip he noticed blood in his mucous. Pt states he was addicted to afrin nasal spray in the past, but has not used it in the couple days. Pt states that preceding two days ago he used it "occationally, but not other day." Pt states he has not have any visual disturbance with his sx. Pt saw an ENT doctor at [facility] two years ago for similar sx. Pt is a current every day smoker.  Pt was initially seen by Dr. Brigitte Pulse December 17th with HA and sinus congestion ongoing for the last three weeks. Had clear nasal discharge with pain radiating to right ear and jaw three weeks. Pt was treated with Augmentin, Prednisone, Atrovent and albuterol nebulizer. Pt was seen in follow up by Dr. Tamala Julian on [facility]. Pt had continued rhinorrhea with clear drainage and improved but still present cough. Pt had also been using Bactroban mixed with saline nasal spray. Pt  was treated for acute, recurrent sinusitis. Restarted on Augmentin 875 BID for ten days and prednisone, 6 day taper.   Pt has a hx of CAD most recent bruse protocol stress test, May 2014, 11 minutes. Cardiac catheterization with stent placement in the LAD.    Patient Active Problem List   Diagnosis Date Noted  . CAD (coronary artery disease) 02/28/2013  . Tobacco abuse   . Hyperlipidemia   . Systemic hypertension    Past Medical History  Diagnosis Date  . CAD (coronary artery disease)   . Systemic hypertension   . Hyperlipidemia   . Tobacco abuse    Past Surgical History  Procedure Laterality Date  . Coronary angioplasty with stent placement  05/31/2006    drug eluting stent LAD  . Exercise treadmill test  02/04/2013    No significant ischemia  . Nuclear stress test  06/07/2011    No ischemia   No Known Allergies Prior to Admission medications   Medication Sig Start Date End Date Taking? Authorizing Provider  aspirin EC 81 MG tablet Take 81 mg by mouth daily. Take 2 tablets daily   Yes Historical Provider, MD  atorvastatin (LIPITOR) 40 MG tablet TAKE 1 TABLET (40 MG TOTAL) BY MOUTH DAILY. 06/18/14  Yes Mihai Croitoru, MD  fish oil-omega-3 fatty acids 1000 MG capsule Take 2 g by  mouth daily.   Yes Historical Provider, MD  lisinopril (PRINIVIL,ZESTRIL) 20 MG tablet Take 1 tablet (20 mg total) by mouth daily. 02/11/14  Yes Mihai Croitoru, MD  mupirocin ointment (BACTROBAN) 2 % Apply 1 application topically 3 (three) times daily. 09/14/14  Yes Shawnee Knapp, MD  nitroGLYCERIN (NITROSTAT) 0.4 MG SL tablet Place 0.4 mg under the tongue every 5 (five) minutes as needed for chest pain.   Yes Historical Provider, MD  amoxicillin-clavulanate (AUGMENTIN) 875-125 MG per tablet Take 1 tablet by mouth 2 (two) times daily. Patient not taking: Reported on 10/19/2014 09/29/14   Wardell Honour, MD  HYDROcodone-acetaminophen (HYCET) 7.5-325 mg/15 ml solution Take 5-10 mLs by mouth every 4 (four) hours as  needed for moderate pain. 09/14/14   Shawnee Knapp, MD  predniSONE (DELTASONE) 20 MG tablet 2 tabs po qd x 3d, then 1 tab po qd x 3d, then stop Patient not taking: Reported on 10/19/2014 09/29/14   Wardell Honour, MD   History   Social History  . Marital Status: Divorced    Spouse Name: N/A    Number of Children: N/A  . Years of Education: N/A   Occupational History  . Not on file.   Social History Main Topics  . Smoking status: Current Every Day Smoker -- 0.75 packs/day    Types: Cigarettes  . Smokeless tobacco: Former Systems developer  . Alcohol Use: 4.2 - 8.4 oz/week    7-14 Not specified per week  . Drug Use: No  . Sexual Activity: Not on file   Other Topics Concern  . Not on file   Social History Narrative    Review of Systems  Constitutional: Negative for fever and chills.  HENT: Positive for congestion, nosebleeds, rhinorrhea and sinus pressure.   Eyes: Negative for visual disturbance.  Neurological: Positive for headaches.  Psychiatric/Behavioral: Positive for sleep disturbance.       Objective:   Physical Exam  Constitutional: He is oriented to person, place, and time. He appears well-developed and well-nourished.  HENT:  Head: Normocephalic and atraumatic.  Right Ear: Tympanic membrane, external ear and ear canal normal.  Left Ear: Tympanic membrane, external ear and ear canal normal.  Nose: No rhinorrhea.  Mouth/Throat: Oropharynx is clear and moist and mucous membranes are normal. No oropharyngeal exudate or posterior oropharyngeal erythema.  Mucosal irritation along septum, without obvious perpheration  . Unable to to see posterior aspect fo septum. No discharge or bleeding. Frontal and maxillary sinuses are non tender. Tender over the right temporal scalp without any firm nodules palpated.   Eyes: Conjunctivae are normal. Pupils are equal, round, and reactive to light.  Neck: Neck supple.  Cardiovascular: Normal rate, regular rhythm, normal heart sounds and intact  distal pulses.   No murmur heard. Pulmonary/Chest: Effort normal and breath sounds normal. No respiratory distress. He has no wheezes. He has no rhonchi. He has no rales.  Abdominal: Soft. There is no tenderness.  Lymphadenopathy:    He has no cervical adenopathy.  Neurological: He is alert and oriented to person, place, and time.  Skin: Skin is warm and dry. No rash noted.  Psychiatric: He has a normal mood and affect. His behavior is normal.  Nursing note and vitals reviewed.   Filed Vitals:   10/19/14 1453  BP: 128/74  Pulse: 70  Temp: 97.9 F (36.6 C)  TempSrc: Oral  Resp: 19  Height: _0  (1.778 m)  Weight: 185 lb 12.8 oz (84.278 kg)  SpO2: 99%  Results for orders placed or performed in visit on 10/19/14  POCT SEDIMENTATION RATE  Result Value Ref Range   POCT SED RATE 21 0 - 22 mm/hr        Assessment & Plan:  Jose Bright is a 59 y.o. male Right sided temporal headache - Plan: POCT SEDIMENTATION RATE, CT MAXILLOFACIAL LTD WO CM, Ambulatory referral to ENT, CANCELED: CT Maxillofacial LTD WO CM, CANCELED: POCT SEDIMENTATION RATE  Chronic sinusitis, unspecified location - Plan: Ambulatory referral to ENT  Rhinorrhea - Plan: Ambulatory referral to ENT  Watering of right eye  Recurrent possible sinusitis now s/p 2 rounds of Augmentin and prednisone. Symptomatically has improved with sinus symptoms, but still with R sided headaches intermittently.  With associated rhinorrhea and lacrimation, DDX includes cluster headaches. Initially concerned with temporal arteritis, but ESR normal.   -Recommended CT of sinuses to determine if infection present prior to another course of antibiotics.  Declined tonight, but advised to call me this week to order CT if amenable at that time.  We will check into cost for him.   -saline NS, tylenol if needed, avoid further Qtips/instrumentation of nasal passages.  -refer to ENT.   Consider neuro eval for cluster HA and possible  neuroimaging if HA's persist. I can do this without OV as well if he would like.   -RTC precautions.   No orders of the defined types were placed in this encounter.   Patient Instructions  I will refer you back to [facility].  If sinus pressure, pain or discharge not continuing to improve this week with use of saline nasal spray - I recommend we obtain a CT scan of the sinuses as was recommended today.  I can order this anytime this week without an office visit - let me know. Avoid any further qtips inside of nose.   I will let you know about the results of the inflammation test tonight.  If it is elevated, we will discuss other causes of headache and workup needed. If any vision changes - go to emergency room.   Your symptoms may also be due to cluster headaches as these can present with one sided headache, runny nose and burning/watering eyes. If headaches are not improving as sinuses improve - I can refer you to a neurologist or headache specialist to treat and evaluate this, as you may need other testing such as a head cat scan.  If you do have an acute attack of the headache - can try oxygen as treatment here or in emergency room acutely. Sometimes medicine like sumatriptan (also used for migraines) can treat cluster headaches, but with history of heart disease, would want clearance from cardiologist prior to prescribing this medicine.   For now - tylenol as needed, saline nasal spray, ok to continue flonase, and if not improving this week - let me know as above.    Return to the clinic or go to the nearest emergency room if any of your symptoms worsen or new symptoms occur.    \

## 2014-10-19 NOTE — Telephone Encounter (Signed)
Dr. Neva SeatGreene would like for us to see if we can figure out how much a CT limited sinus would be for this pt.  If we can let him know and then order if pt decides he would like to do it.

## 2014-10-19 NOTE — Patient Instructions (Addendum)
I will refer you back to [facility].  If sinus pressure, pain or discharge not continuing to improve this week with use of saline nasal spray - I recommend we obtain a CT scan of the sinuses as was recommended today.  I can order this anytime this week without an office visit - let me know. Avoid any further qtips inside of nose.   I will let you know about the results of the inflammation test tonight.  If it is elevated, we will discuss other causes of headache and workup needed. If any vision changes - go to emergency room.   Your symptoms may also be due to cluster headaches as these can present with one sided headache, runny nose and burning/watering eyes. If headaches are not improving as sinuses improve - I can refer you to a neurologist or headache specialist to treat and evaluate this, as you may need other testing such as a head cat scan.  If you do have an acute attack of the headache - can try oxygen as treatment here or in emergency room acutely. Sometimes medicine like sumatriptan (also used for migraines) can treat cluster headaches, but with history of heart disease, would want clearance from cardiologist prior to prescribing this medicine.   For now - tylenol as needed, saline nasal spray, ok to continue flonase, and if not improving this week - let me know as above.    Return to the clinic or go to the nearest emergency room if any of your symptoms worsen or new symptoms occur.

## 2014-10-20 NOTE — Telephone Encounter (Signed)
LM for pt to contact insurance company to find out his cost or if deductible applies.

## 2014-12-29 ENCOUNTER — Telehealth: Payer: Self-pay | Admitting: Cardiovascular Disease

## 2014-12-29 ENCOUNTER — Other Ambulatory Visit: Payer: Self-pay | Admitting: Family Medicine

## 2014-12-29 DIAGNOSIS — I25810 Atherosclerosis of coronary artery bypass graft(s) without angina pectoris: Secondary | ICD-10-CM

## 2014-12-29 NOTE — Telephone Encounter (Signed)
Re: DOT physical annual.  If OK, can set up for OV & stress test if needed -  please advise.

## 2014-12-29 NOTE — Telephone Encounter (Signed)
OK to setup same day, preferably stress test first

## 2014-12-29 NOTE — Telephone Encounter (Signed)
Needs DOT physical.  Wants to know if he has to have a stress test and if so, can he have the stress test and see Dr. Salena Saner on the same day.  Patient doesn't want to have to come twice.

## 2014-12-30 NOTE — Telephone Encounter (Signed)
Pt has stress test & OV on books for 4/29.

## 2014-12-30 NOTE — Telephone Encounter (Signed)
Ordered. Routing to scheduling.

## 2015-01-06 ENCOUNTER — Other Ambulatory Visit: Payer: Self-pay | Admitting: Family Medicine

## 2015-01-28 ENCOUNTER — Telehealth (HOSPITAL_COMMUNITY): Payer: Self-pay

## 2015-01-28 NOTE — Telephone Encounter (Signed)
Encounter complete. 

## 2015-01-30 ENCOUNTER — Ambulatory Visit (INDEPENDENT_AMBULATORY_CARE_PROVIDER_SITE_OTHER): Payer: BLUE CROSS/BLUE SHIELD | Admitting: Cardiovascular Disease

## 2015-01-30 ENCOUNTER — Encounter: Payer: Self-pay | Admitting: Cardiovascular Disease

## 2015-01-30 ENCOUNTER — Ambulatory Visit (INDEPENDENT_AMBULATORY_CARE_PROVIDER_SITE_OTHER): Payer: Self-pay | Admitting: Emergency Medicine

## 2015-01-30 ENCOUNTER — Ambulatory Visit (HOSPITAL_COMMUNITY)
Admission: RE | Admit: 2015-01-30 | Discharge: 2015-01-30 | Disposition: A | Discharge status: Home / Self Care | Payer: BLUE CROSS/BLUE SHIELD | Source: Ambulatory Visit | Attending: Internal Medicine | Admitting: Internal Medicine

## 2015-01-30 VITALS — BP 114/64 | HR 76 | Resp 20 | Ht 70.0 in | Wt 187.8 lb

## 2015-01-30 VITALS — BP 124/84 | HR 74 | Temp 98.0°F | Resp 16 | Ht 71.0 in | Wt 187.4 lb

## 2015-01-30 DIAGNOSIS — I25810 Atherosclerosis of coronary artery bypass graft(s) without angina pectoris: Secondary | ICD-10-CM

## 2015-01-30 DIAGNOSIS — I10 Essential (primary) hypertension: Secondary | ICD-10-CM

## 2015-01-30 DIAGNOSIS — Z720 Tobacco use: Secondary | ICD-10-CM | POA: Insufficient documentation

## 2015-01-30 DIAGNOSIS — E785 Hyperlipidemia, unspecified: Secondary | ICD-10-CM | POA: Insufficient documentation

## 2015-01-30 DIAGNOSIS — R0609 Other forms of dyspnea: Secondary | ICD-10-CM | POA: Diagnosis not present

## 2015-01-30 DIAGNOSIS — Z8249 Family history of ischemic heart disease and other diseases of the circulatory system: Secondary | ICD-10-CM | POA: Diagnosis not present

## 2015-01-30 DIAGNOSIS — I2510 Atherosclerotic heart disease of native coronary artery without angina pectoris: Secondary | ICD-10-CM

## 2015-01-30 DIAGNOSIS — Z0000 Encounter for general adult medical examination without abnormal findings: Secondary | ICD-10-CM

## 2015-01-30 DIAGNOSIS — Z79899 Other long term (current) drug therapy: Secondary | ICD-10-CM

## 2015-01-30 LAB — COMPREHENSIVE METABOLIC PANEL
ALT: 15 U/L (ref 0–53)
AST: 16 U/L (ref 0–37)
Albumin: 4.2 g/dL (ref 3.5–5.2)
Alkaline Phosphatase: 47 U/L (ref 39–117)
BUN: 11 mg/dL (ref 6–23)
CHLORIDE: 104 meq/L (ref 96–112)
CO2: 26 meq/L (ref 19–32)
Calcium: 9.4 mg/dL (ref 8.4–10.5)
Creat: 0.76 mg/dL (ref 0.50–1.35)
GLUCOSE: 99 mg/dL (ref 70–99)
Potassium: 4.2 mEq/L (ref 3.5–5.3)
SODIUM: 140 meq/L (ref 135–145)
TOTAL PROTEIN: 6.1 g/dL (ref 6.0–8.3)
Total Bilirubin: 1.1 mg/dL (ref 0.2–1.2)

## 2015-01-30 LAB — LIPID PANEL
Cholesterol: 129 mg/dL (ref 0–200)
HDL: 41 mg/dL (ref >=40)
LDL Cholesterol: 73 mg/dL (ref 0–99)
TRIGLYCERIDES: 75 mg/dL (ref <150)
Total CHOL/HDL Ratio: 3.1 Ratio
VLDL: 15 mg/dL (ref 0–40)

## 2015-01-30 MED ORDER — TECHNETIUM TC 99M SESTAMIBI GENERIC - CARDIOLITE
32.0000 | Freq: Once | INTRAVENOUS | Status: AC | PRN
Start: 2015-01-30 — End: 2015-01-30
  Administered 2015-01-30: 32 via INTRAVENOUS

## 2015-01-30 MED ORDER — TECHNETIUM TC 99M SESTAMIBI GENERIC - CARDIOLITE
10.8000 | Freq: Once | INTRAVENOUS | Status: AC | PRN
  Administered 2015-01-30: 11 via INTRAVENOUS

## 2015-01-30 NOTE — Progress Notes (Signed)
Patient ID: Jose Bright, male   DOB: June 30, 1956, 59 y.o.   MRN: [id number]     Cardiology Office Note   Date:  01/30/2015   ID:  Jose Bright, DOB [birth year], MRN [id number]  PCP:  Tally Due, MD  Cardiologist:   Thurmon Fair, MD   Chief Complaint  Patient presents with  . Annual Exam      History of Present Illness: Jose Bright is a 59 y.o. male who presents for yearly follow-up for coronary artery disease. He received a drug eluting stent to the mid LAD artery in 2007 and has not had new coronary event since that time. Unfortunately he continues to smoke roughly a pack of cigarettes daily. 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 He had his treadmill stress test today. Exercise capacity was good at 9 minutes on the Bruce protocol. There are very subtle ECG abnormalities seen especially in early recovery and not meeting criteria for a diagnosis of ischemia. His nuclear images were normal without any perfusion defects. Left ventricular systolic function and wall motion were also normal. He did not have any chest discomfort during the treadmill exercise but developed wheezing and shortness of breath.  He denies any exertional angina but has noticed some reduction in his stamina due to shortness of breath. In 2012 and 2014 he was able to exercise for 11 minutes on the Bruce protocol.  He has a history of progressive angina pectoris eventually leading to cardiac catheterization in 2007. At that time he had a high-grade mid LAD stenosis dose treatment for placement of a drug-eluting stent. It is also noted to have a 20% ostial left main stenosis, a left circumflex coronary free of lesions, a 30% mid RCA stenosis and40% stenosis in a large posterior lateral ventricular branch of the right coronary artery. He has not had a new coronary event since that time. He works as a Naval architect. DOT requires a stress test every  other year - we checked one in 2014. He exercised for >11 minutes, just like at his nuclear scan in 2012. Both studies were normal. He has treated hypertension and hyperlipidemia.  Past Medical History  Diagnosis Date  . CAD (coronary artery disease)   . Systemic hypertension   . Hyperlipidemia   . Tobacco abuse     Past Surgical History  Procedure Laterality Date  . Coronary angioplasty with stent placement  05/31/2006    drug eluting stent LAD  . Exercise treadmill test  02/04/2013    No significant ischemia  . Nuclear stress test  06/07/2011    No ischemia     Current Outpatient Prescriptions  Medication Sig Dispense Refill  . aspirin EC 81 MG tablet Take 81 mg by mouth daily. Take 2 tablets daily    . atorvastatin (LIPITOR) 40 MG tablet TAKE 1 TABLET (40 MG TOTAL) BY MOUTH DAILY. 90 tablet 2  . lisinopril (PRINIVIL,ZESTRIL) 20 MG tablet Take 1 tablet (20 mg total) by mouth daily. 90 tablet 3  . mupirocin ointment (BACTROBAN) 2 % Apply 1 application topically 3 (three) times daily. 30 g 1  . nitroGLYCERIN (NITROSTAT) 0.4 MG SL tablet Place 0.4 mg under the tongue every 5 (five) minutes as needed for chest pain.    . fluticasone (FLONASE) 50 MCG/ACT nasal spray Place 1 spray into both nostrils daily.  5   No current facility-administered medications for this visit.    Allergies:   Review  of patient's allergies indicates no known allergies.    Social History:  The patient  reports that he has been smoking Cigarettes.  He has been smoking about 0.75 packs per day. He has quit using smokeless tobacco. He reports that he drinks about 4.2 - 8.4 oz of alcohol per week. He reports that he does not use illicit drugs.   Family History:  The patient's family history includes Heart attack in his father; Heart failure in his father; Hypertension in his father.    ROS:  Please see the history of present illness.    Otherwise, review of systems positive for daily morning cough, productive of  clear sputum.   All other systems are reviewed and negative.    PHYSICAL EXAM: VS:  BP 114/64 mmHg  Pulse 76  Ht  (1.778 m)  Wt 187 lb 12.8 oz (85.186 kg)  BMI 26.95 kg/m2 , BMI Body mass index is 26.95 kg/(m^2).  General: Alert, oriented x3, no distress Head: no evidence of trauma, PERRL, EOMI, no exophtalmos or lid lag, no myxedema, no xanthelasma; normal ears, nose and oropharynx Neck: normal jugular venous pulsations and no hepatojugular reflux; brisk carotid pulses without delay and no carotid bruits Chest: clear to auscultation, no signs of consolidation by percussion or palpation, normal fremitus, symmetrical and full respiratory excursions Cardiovascular: normal position and quality of the apical impulse, regular rhythm, normal first and second heart sounds, no murmurs, rubs or gallops Abdomen: no tenderness or distention, no masses by palpation, no abnormal pulsatility or arterial bruits, normal bowel sounds, no hepatosplenomegaly Extremities: no clubbing, cyanosis or edema; 2+ radial, ulnar and brachial pulses bilaterally; 2+ right femoral, posterior tibial and dorsalis pedis pulses; 2+ left femoral, posterior tibial and dorsalis pedis pulses; no subclavian or femoral bruits Neurological: grossly nonfocal Psych: euthymic mood, full affect   EKG:  EKG is ordered today. The ekg ordered today demonstrates NSR   Recent Labs: 02/11/2014: ALT 16; BUN 13; Creatinine 0.89; Potassium 4.6; Sodium 140    Lipid Panel    Component Value Date/Time   CHOL 139 02/11/2014 0905   TRIG 109 02/11/2014 0905   HDL 35* 02/11/2014 0905   CHOLHDL 4.0 02/11/2014 0905   VLDL 22 02/11/2014 0905   LDLCALC 82 02/11/2014 0905      Wt Readings from Last 3 Encounters:  01/30/15 187 lb 12.8 oz (85.186 kg)  10/19/14 185 lb 12.8 oz (84.278 kg)  09/29/14 185 lb (83.915 kg)      Other studies Reviewed: Additional studies/ records that were reviewed today include: Nuclear stress test  images, ECG tracings  ASSESSMENT AND PLAN:  CAD (coronary artery disease)  He has no symptoms of active coronary insufficiency. He was able to exercise for 9 minutes on the standardized Bruce protocol. While this still shows good exercise capacity, there is a reduction since 2014. I suspect it is related to lung problems.  Systemic hypertension  BP excellent.  Hyperlipidemia  Satisfactory LDL last year, recheck today. Ideally would be <70. Reviewed a cardiac healthy diet. Exercise will help HDL.   Tobacco abuse  We spent over 10 minutes discussing the critical importance of smoking cessation for his future health - went over exactly the same issues every year. I think he has evidence of chronic bronchitis/COPD and probably already has irreversible lung injury..   Current medicines are reviewed at length with the patient today.  The patient does not have concerns regarding medicines.  The following changes have been made:  no change  Labs/ tests ordered today include:  Orders Placed This Encounter  Procedures  . Lipid panel  . Comprehensive metabolic panel    Patient Instructions  Your physician recommends that you return for lab work in: [facility].  Dr. Royann Shiversroitoru recommends that you schedule a follow-up appointment in: One year.       Jose BimlerSigned, Jose Vayda, MD  01/30/2015 4:33 PM    Thurmon FairMihai Miela Desjardin, MD, [facility] [phone] office [phone] pager

## 2015-01-30 NOTE — Patient Instructions (Signed)
Your physician recommends that you return for lab work in: FASTING at [facility].  Dr. Croitoru recommends that you schedule a follow-up appointment in: One year.    

## 2015-01-30 NOTE — Procedures (Addendum)
[facility] [address] [place] [address] [phone]  Cardiology Nuclear Med Study  Jose Bright is a 59 y.o. male     MRN : [id number]     DOB: 05/10/1956  Procedure Date: 01/30/2015  Nuclear Med Background Indication for Stress Test:  Follow up CAD;DOT Physical History:  CAD;STENT/PTCA-05/31/2006;Last NUC MPI on 06/07/2011-normal;EF=66% Cardiac Risk Factors: Family History - CAD, Hypertension, Lipids and Smoker  Symptoms:  DOE   Nuclear Pre-Procedure Caffeine/Decaff Intake:  12:30am NPO After: 8:30am   IV Site: R Forearm  IV 0.9% NS with Angio Cath:  22g  Chest Size (in):  42"  IV Started by: Reece LeaderAmanda Robbins, RN  Height: 5\' 10"  (1.778 m)  Cup Size: n/a  BMI:  Body mass index is 26.54 kg/(m^2). Weight:  185 lb (83.915 kg)   Tech Comments:  n/a    Nuclear Med Study 1 or 2 day study: 1 day  Stress Test Type:  Stress  Order Authorizing Provider:  Thurmon FairMihai Praise Dolecki, MD   Resting Radionuclide: Technetium 7247m Sestamibi  Resting Radionuclide Dose: 10.8 mCi   Stress Radionuclide:  Technetium 4647m Sestamibi  Stress Radionuclide Dose: 32.0 mCi           Stress Protocol Rest HR: 72 Stress HR: 144  Rest BP: 116/79 Stress BP: 173/85  Exercise Time (min): 9 METS: 10.1   Predicted Max HR: 161 bpm % Max HR: 89.44 bpm Rate Pressure Product: [id number]  Dose of Adenosine (mg):  n/a Dose of Lexiscan: n/a mg  Dose of Atropine (mg): n/a Dose of Dobutamine: n/a mcg/kg/min (at max HR)  Stress Test Technologist: Esperanza Sheetserry-Marie Martin, CCT Nuclear Technologist: Gonzella LexPam Phillips, CNMT   Rest Procedure:  Myocardial perfusion imaging was performed at rest 45 minutes following the intravenous administration of Technetium 7647m Sestamibi. Stress Procedure:  The patient performed treadmill exercise using a Bruce  Protocol for 9 minutes. The patient stopped due to Fatigue and SOB and denied any chest pain.  There were no significant ST-T wave changes.   Technetium 4347m Sestamibi was injected at peak exercise and myocardial perfusion imaging was performed after a brief delay.  Transient Ischemic Dilatation (Normal <1.22):  0.95 QGS EDV:  102 ml QGS ESV:  39 ml LV Ejection Fraction: 62%       Rest ECG: NSR - Normal EKG  Stress ECG: Insignificant upsloping ST segment depression. There is <1 mm ST horizontal depression in early recovery  QPS Raw Data Images:  Mild diaphragmatic attenuation.  Normal left ventricular size. Stress Images:  Normal homogeneous uptake in all areas of the myocardium. Rest Images:  Normal homogeneous uptake in all areas of the myocardium. Subtraction (SDS):  No evidence of ischemia. LV Wall Motion:  NL LV Function; NL Wall Motion  Impression Exercise Capacity:  Good exercise capacity. BP Response:  Normal blood pressure response. Clinical Symptoms:  No significant symptoms noted. ECG Impression:  Insignificant upsloping ST segment depression. Comparison with Prior Nuclear Study: No significant change from previous study   Overall Impression:  Normal stress nuclear study. and Low risk stress nuclear study with mild diaphragmatic attenuation artifact.   Thurmon FairROITORU,Delores Edelstein, MD  01/30/2015 3:37 PM

## 2015-01-30 NOTE — Progress Notes (Signed)
   Subjective:    Patient ID: Jose Bright, male    DOB: 05/12/1956, 59 y.o.   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 He has coronary artery disease and had a stent placed in 2007. He has regular follow-ups with his cardiologist. He has hypertension and high cholesterol and continues on those medications. Today he had his nuclear stress test done. This showed no evidence of ischemia. He brings his results with him. He continues to smoke.    Review of Systems     Objective:   Physical Exam  Constitutional: He appears well-developed and well-nourished.  HENT:  Head: Normocephalic.  Right Ear: External ear normal.  Left Ear: External ear normal.  Eyes: Pupils are equal, round, and reactive to light.  Neck: Normal range of motion. Neck supple. No tracheal deviation present. No thyromegaly present.  Cardiovascular: Normal rate and regular rhythm.   Pulmonary/Chest: Effort normal and breath sounds normal. No respiratory distress. He has no wheezes. He has no rales. He exhibits no tenderness.  Abdominal: Soft. Bowel sounds are normal. He exhibits no distension. There is no tenderness.  Musculoskeletal: Normal range of motion.  Neurological: He is alert.  Skin: Skin is warm and dry.  Psychiatric: He has a normal mood and affect. His behavior is normal. Thought content normal.          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 Forms were completed. He had a stress test today which was normal. He was advised to quit smoking.

## 2015-04-20 ENCOUNTER — Other Ambulatory Visit: Payer: Self-pay | Admitting: Cardiovascular Disease

## 2015-04-21 NOTE — Telephone Encounter (Signed)
Rx(s) sent to pharmacy electronically.  

## 2016-01-15 ENCOUNTER — Ambulatory Visit (INDEPENDENT_AMBULATORY_CARE_PROVIDER_SITE_OTHER): Payer: Self-pay | Admitting: Physician Assistant

## 2016-01-15 VITALS — BP 130/76 | HR 62 | Temp 97.9°F | Resp 16 | Ht 70.0 in | Wt 182.8 lb

## 2016-01-15 DIAGNOSIS — Z021 Encounter for pre-employment examination: Secondary | ICD-10-CM

## 2016-01-15 DIAGNOSIS — Z024 Encounter for examination for driving license: Secondary | ICD-10-CM

## 2016-01-15 NOTE — Patient Instructions (Signed)
     IF you received an x-ray today, you will receive an invoice from [facility]. Please contact [facility] at [phone] with questions or concerns regarding your invoice.   IF you received labwork today, you will receive an invoice from [facility]. Please contact [facility] at [phone] with questions or concerns regarding your invoice.   Our billing staff will not be able to assist you with questions regarding bills from these companies.  You will be contacted with the lab results as soon as they are available. The fastest way to get your results is to activate your My Chart account. Instructions are located on the last page of this paperwork. If you have not heard from us regarding the results in 2 weeks, please contact this office.      

## 2016-01-15 NOTE — Progress Notes (Signed)
This patient presents for DOT examination for fitness for duty.  Medical History: no  Any illness or injury in the last 5 years? no  Head/Brain Injuries, disorders or illnesses no  Seizures, epilepsy no  Eye disorders or impaired vision (except corrective lenses) no  Ear disorders, loss of hearing or balance yes  Heart disease or heart attack; other cardiovascular condition - CAD - stent placed 2007 - normal stress 01/2015 yes  Heart surgery (valve replacement/bypass, angioplasty, pacemaker) - 2007 stent placement yes  High blood pressure - on medications - well controlled no  Muscular disease no  Shortness of breath no  Lung disease, emphysema, asthma, chronic bronchitis no  Kidney disease, dialysis no  Liver disease no  Digestive problems no  Diabetes or elevated blood sugar no  Nervious or psychiatric disorders, e.g., severe depression no  Loss of, or altered consciousness no  Fainting, dizziness no  Sleep disorders, pauses in breathing while asleep, daytime sleepiness, loud snoring no  Stroke or paralysis no  Missing or impaired hand, arm, foot, leg, finger, toe no  Spinal injury or disease no  Chronic low back pain yes  Regular, frequent alcohol use -1-2 beers a day -  no  Narcotic or habit forming drug use  Current Medications: Prior to Admission medications   Medication Sig Start Date End Date Taking? Authorizing Provider  atorvastatin (LIPITOR) 40 MG tablet TAKE 1 TABLET (40 MG TOTAL) BY MOUTH DAILY. 04/21/15  Yes Mihai Croitoru, MD  fluticasone (FLONASE) 50 MCG/ACT nasal spray Place 1 spray into both nostrils daily. 12/29/14  Yes Historical Provider, MD  lisinopril (PRINIVIL,ZESTRIL) 20 MG tablet TAKE 1 TABLET (20 MG TOTAL) BY MOUTH DAILY. 04/21/15  Yes Mihai Croitoru, MD  nitroGLYCERIN (NITROSTAT) 0.4 MG SL tablet Place 0.4 mg under the tongue every 5 (five) minutes as needed for chest pain.   Yes Historical Provider, MD    Medical Examiner's Comments on Health History:   Smokes <1ppd  TESTING:   Visual Acuity Screening   Right eye Left eye Both eyes  Without correction: 20/25 20/25 20/20   With correction:     Comments: Peripheral Vision: Right eye 85 degrees. Left eye 85 degrees.  Hearing Screening Comments: Whisper: Patient able to hear at 4210ft both ears jp/cma   Monocular Vision: No.  Hearing Aid used for test: No. Hearing Aid required to to meet standard: No.  BP 130/76 mmHg  Pulse 62  Temp(Src) 97.9 F (36.6 C) (Oral)  Resp 16  Ht 5\' 10"  (1.778 m)  Wt 182 lb 12.8 oz (82.918 kg)  BMI 26.23 kg/m2  SpO2 96% Pulse rate is regular  Urine Specimen: Specific Gravity 1.010, Protein neg, Blood trace, Sugar neg  PHYSICAL EXAMINATION:  1. No. General Appearance: Marked overweight, tremor, signs of alcoholism, problem drinking or drug abuse. 2. No. Eyes: pupillary equality, reaction to light, accommodation, ocular motility, ocular muscle imbalance, extra ocular movement, nystagmus, exopthalmos. Ask about retinopathy, cataracts, aphakia, glaucoma, macular degeneration and refer to a specialist if appropriate.  3. No. Ears: Scarring of tympanic membrane, occlusion of external canal, perforated eardrums.     4. No. Mouth and Throat: Irremedial deformities likely to interfere with breathing or swallowing.    5. No. Heart: Murmurs, extra sounds, enlarged heart, pacemaker, implantable defibrillator.     6. No. Lungs and Chest, not including breast examination: Abnormal Chest wall expansion, abnormal respiratory rate, abnormal breath sounds including wheezes or alveolar rates, impaired respiratory function, cyanosis. Abnormal findings on physical exam may  require further testing such as pulmonary tests and/or x ray of chest.  7. No. Abdomen and Viscera: Enlarged liver, enlarged spleen, masses, bruits, hernia, significant abdominal wall muscle weakness.  8. No. Vascular System: Abnormal pulse and amplitude, carotid or arterial bruits, varicose veins.     9. No. Genitourinary System: Hernia  10. No. Extremities-Limb impaired: Loss or impairment of leg, foot, toe, arm, hand, finger. Perceptible limp, deformities, atrophy, weakness, paralysis, clubbing, edema, hypotonia. Insufficient grasp and prehension to maintain steering wheel grip. Insufficient mobility and strength in lower limb to operate pedals properly. 11. No. Spine, other musculoskeletal: Previous surgery, deformities, limitation of motion, tenderness.  12. No. Neurological: Impaired equilibrium, coordination or speech pattern; paresthesia, asymmetric deep tendon reflexes, sensory or positional abnormalities, abnormal patellar and Babinski's reflexes, ataxia.     Comments:   Certification Status: does not meet standards for 2 year certificate. Does not meet standards. Meets standards, but periodic monitoring required due to: HTN, stent placement  Driver qualified only for: 1 year     Wearing corrective lenses: no Wearing hearing aid: no Accompanied by a no waiver/exemption Skill performance Evaluation (SPE) Certificate: no Driving within an exempt intracity zone: no Qualified by operation of 49 CFR 391.64: no  Certification expires 01/14/2017  Benny Lennert PA-C  [facility] 01/15/2016 3:27 PM

## 2016-02-22 ENCOUNTER — Other Ambulatory Visit: Payer: Self-pay | Admitting: Cardiovascular Disease

## 2016-02-23 NOTE — Telephone Encounter (Signed)
Rx(s) sent to pharmacy electronically.  

## 2016-05-31 ENCOUNTER — Other Ambulatory Visit: Payer: Self-pay | Admitting: Cardiovascular Disease

## 2016-05-31 NOTE — Telephone Encounter (Signed)
Rx request sent to pharmacy.  

## 2016-10-17 ENCOUNTER — Other Ambulatory Visit: Payer: Self-pay | Admitting: Cardiovascular Disease

## 2016-10-17 NOTE — Telephone Encounter (Signed)
Rx(s) sent to pharmacy electronically.  

## 2016-11-10 ENCOUNTER — Telehealth: Payer: Self-pay | Admitting: Cardiovascular Disease

## 2016-11-10 DIAGNOSIS — I2510 Atherosclerotic heart disease of native coronary artery without angina pectoris: Secondary | ICD-10-CM

## 2016-11-10 DIAGNOSIS — Z0289 Encounter for other administrative examinations: Secondary | ICD-10-CM

## 2016-11-10 MED ORDER — ATORVASTATIN CALCIUM 40 MG PO TABS: 40.0000 mg | ORAL_TABLET | Freq: Every day | ORAL | 1 refills | Status: DC

## 2016-11-10 MED ORDER — LISINOPRIL 20 MG PO TABS: 20.0000 mg | ORAL_TABLET | Freq: Every day | ORAL | 1 refills | Status: DC

## 2016-11-10 NOTE — Telephone Encounter (Signed)
New Message   *STAT* If patient is at the pharmacy, call can be transferred to refill team.   1. Which medications need to be refilled? (please list name of each medication and dose if known)  lisinopril (Prinivil, Zestril) 20 mg tablet once daily atorvastatin (Lipitor) 40 mg tablet once daily  2. Which pharmacy/location (including street and city if local pharmacy) is medication to be sent to? [facility] [phone], [address], [address], [place], [place]  3. Do they need a 30 day or 90 day supply? 60 day supply or up until pts appt 4.6.18.

## 2016-11-10 NOTE — Telephone Encounter (Signed)
Please order treadmill Myoview. MCr

## 2016-11-10 NOTE — Telephone Encounter (Signed)
Rx(s) sent to pharmacy electronically.  

## 2016-11-10 NOTE — Telephone Encounter (Signed)
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

## 2016-11-10 NOTE — Telephone Encounter (Signed)
Patient returned call. 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 He will need another stress test.   Will defer to MD to advise on what kind of test  Patient would like to have stress test on same day as appt w/MD. Informed patient that I am unsure if this can be arranged but we will do our best to accomodate his request.   Message routed to MD

## 2016-11-10 NOTE — Telephone Encounter (Signed)
New Message  Pt voiced he was over due for about two years.  Pt voiced he needs an echo or stress test same day of appt if possible, did not see order in EPIC.  Please f/u with pt for scheduling echo/order

## 2016-11-10 NOTE — Telephone Encounter (Signed)
Exercise myoview ordered. [facility].

## 2016-11-11 ENCOUNTER — Telehealth (HOSPITAL_COMMUNITY): Payer: Self-pay | Admitting: Cardiovascular Disease

## 2016-11-18 NOTE — Telephone Encounter (Signed)
11/11/2016 11:18 AM Phone (Outgoing) Maurene CapesKennedy, [place] (Self) [phone] (H)   Left Message - Called pt and lmsg for him to CB in regards to scheduling a myoview. Order is in the system    By Elita Booneegina A Rick Warnick    11/15/2016 02:23 PM Phone (Outgoing) Maurene CapesKennedy, [place] (Self) [phone] (H)   Completed - Called pt to schedule stress test the same week of Dr. Salena Saner appt. His phone kept losing connection. If/when he calls back , ANYONE CAN SCHEDULE.     By Elita Booneegina A Addie Cederberg

## 2016-12-22 ENCOUNTER — Other Ambulatory Visit: Payer: Self-pay | Admitting: *Deleted

## 2016-12-22 MED ORDER — ATORVASTATIN CALCIUM 40 MG PO TABS: 40.0000 mg | ORAL_TABLET | Freq: Every day | ORAL | 0 refills | Status: DC

## 2016-12-22 MED ORDER — LISINOPRIL 20 MG PO TABS: 20.0000 mg | ORAL_TABLET | Freq: Every day | ORAL | 0 refills | Status: DC

## 2017-01-03 ENCOUNTER — Telehealth (HOSPITAL_COMMUNITY): Payer: Self-pay

## 2017-01-03 NOTE — Telephone Encounter (Signed)
Encounter complete. 

## 2017-01-04 ENCOUNTER — Telehealth (HOSPITAL_COMMUNITY): Payer: Self-pay

## 2017-01-04 NOTE — Telephone Encounter (Signed)
Encounter complete. 

## 2017-01-05 ENCOUNTER — Ambulatory Visit (HOSPITAL_COMMUNITY)
Admission: RE | Admit: 2017-01-05 | Discharge: 2017-01-05 | Disposition: A | Discharge status: Home / Self Care | Payer: BLUE CROSS/BLUE SHIELD | Source: Ambulatory Visit | Attending: Cardiology | Admitting: Cardiology

## 2017-01-05 DIAGNOSIS — I10 Essential (primary) hypertension: Secondary | ICD-10-CM | POA: Insufficient documentation

## 2017-01-05 DIAGNOSIS — F17200 Nicotine dependence, unspecified, uncomplicated: Secondary | ICD-10-CM | POA: Insufficient documentation

## 2017-01-05 DIAGNOSIS — E785 Hyperlipidemia, unspecified: Secondary | ICD-10-CM | POA: Insufficient documentation

## 2017-01-05 DIAGNOSIS — I2510 Atherosclerotic heart disease of native coronary artery without angina pectoris: Secondary | ICD-10-CM

## 2017-01-05 DIAGNOSIS — Z8249 Family history of ischemic heart disease and other diseases of the circulatory system: Secondary | ICD-10-CM | POA: Diagnosis not present

## 2017-01-05 DIAGNOSIS — Z0289 Encounter for other administrative examinations: Secondary | ICD-10-CM | POA: Diagnosis not present

## 2017-01-05 LAB — MYOCARDIAL PERFUSION IMAGING
CHL CUP MPHR: 159 {beats}/min
CHL CUP RESTING HR STRESS: 52 {beats}/min
CHL RATE OF PERCEIVED EXERTION: 18
CSEPED: 10 min
Estimated workload: 11.7 METS
Exercise duration (sec): 1 s
LV sys vol: 66 mL
LVDIAVOL: 128 mL (ref 62–150)
NUC STRESS TID: 1.22
Peak HR: 148 {beats}/min
Percent HR: 93 %
SDS: 1
SRS: 2
SSS: 3

## 2017-01-05 MED ORDER — TECHNETIUM TC 99M TETROFOSMIN IV KIT
10.0000 | PACK | Freq: Once | INTRAVENOUS | Status: AC | PRN
  Administered 2017-01-05: 10 via INTRAVENOUS
  Filled 2017-01-05: qty 10

## 2017-01-05 MED ORDER — TECHNETIUM TC 99M TETROFOSMIN IV KIT
32.0000 | PACK | Freq: Once | INTRAVENOUS | Status: AC | PRN
  Administered 2017-01-05: 32 via INTRAVENOUS
  Filled 2017-01-05: qty 32

## 2017-01-06 ENCOUNTER — Encounter: Payer: Self-pay | Admitting: Cardiovascular Disease

## 2017-01-06 ENCOUNTER — Ambulatory Visit (INDEPENDENT_AMBULATORY_CARE_PROVIDER_SITE_OTHER): Payer: BLUE CROSS/BLUE SHIELD | Admitting: Cardiovascular Disease

## 2017-01-06 ENCOUNTER — Ambulatory Visit (INDEPENDENT_AMBULATORY_CARE_PROVIDER_SITE_OTHER): Payer: Self-pay | Admitting: Urgent Care

## 2017-01-06 VITALS — BP 128/77 | HR 60 | Temp 97.9°F | Resp 14 | Ht 70.5 in | Wt 186.0 lb

## 2017-01-06 VITALS — BP 110/80 | HR 62 | Ht 70.0 in | Wt 193.0 lb

## 2017-01-06 DIAGNOSIS — I2510 Atherosclerotic heart disease of native coronary artery without angina pectoris: Secondary | ICD-10-CM | POA: Diagnosis not present

## 2017-01-06 DIAGNOSIS — E785 Hyperlipidemia, unspecified: Secondary | ICD-10-CM | POA: Diagnosis not present

## 2017-01-06 DIAGNOSIS — F17200 Nicotine dependence, unspecified, uncomplicated: Secondary | ICD-10-CM

## 2017-01-06 DIAGNOSIS — I10 Essential (primary) hypertension: Secondary | ICD-10-CM

## 2017-01-06 DIAGNOSIS — Z024 Encounter for examination for driving license: Secondary | ICD-10-CM

## 2017-01-06 DIAGNOSIS — Z79899 Other long term (current) drug therapy: Secondary | ICD-10-CM | POA: Diagnosis not present

## 2017-01-06 DIAGNOSIS — Z720 Tobacco use: Secondary | ICD-10-CM | POA: Diagnosis not present

## 2017-01-06 DIAGNOSIS — E782 Mixed hyperlipidemia: Secondary | ICD-10-CM

## 2017-01-06 LAB — COMPREHENSIVE METABOLIC PANEL
ALT: 19 U/L (ref 9–46)
AST: 17 U/L (ref 10–35)
Albumin: 4.4 g/dL (ref 3.6–5.1)
Alkaline Phosphatase: 37 U/L — ABNORMAL LOW (ref 40–115)
BUN: 13 mg/dL (ref 7–25)
CHLORIDE: 105 mmol/L (ref 98–110)
CO2: 29 mmol/L (ref 20–31)
Calcium: 8.9 mg/dL (ref 8.6–10.3)
Creat: 0.96 mg/dL (ref 0.70–1.25)
Glucose, Bld: 100 mg/dL — ABNORMAL HIGH (ref 65–99)
POTASSIUM: 4.5 mmol/L (ref 3.5–5.3)
Sodium: 140 mmol/L (ref 135–146)
TOTAL PROTEIN: 6.6 g/dL (ref 6.1–8.1)
Total Bilirubin: 0.9 mg/dL (ref 0.2–1.2)

## 2017-01-06 LAB — LIPID PANEL
CHOL/HDL RATIO: 2.5 ratio (ref <5.0)
Cholesterol: 131 mg/dL (ref <200)
HDL: 53 mg/dL (ref >40)
LDL CALC: 67 mg/dL (ref <100)
TRIGLYCERIDES: 53 mg/dL (ref <150)
VLDL: 11 mg/dL (ref <30)

## 2017-01-06 NOTE — Patient Instructions (Addendum)
Keeping you healthy  Get these tests  Blood pressure- Have your blood pressure checked once a year by your healthcare provider.  Normal blood pressure is 120/80  Weight- Have your body mass index (BMI) calculated to screen for obesity.  BMI is a measure of body fat based on height and weight. You can also calculate your own BMI at [email or web address].com/bmi/.  Cholesterol- Have your cholesterol checked every year.  Diabetes- Have your blood sugar checked regularly if you have high blood pressure, high cholesterol, have a family history of diabetes or if you are overweight.  Screening for Colon Cancer- Colonoscopy starting at age 50.  Screening may begin sooner depending on your family history and other health conditions. Follow up colonoscopy as directed by your Gastroenterologist.  Screening for Prostate Cancer- Both blood work (PSA) and a rectal exam help screen for Prostate Cancer.  Screening begins at age 40 with African-American men and at age 50 with Caucasian men.  Screening may begin sooner depending on your family history.  Take these medicines  Aspirin- One aspirin daily can help prevent Heart disease and Stroke.  Flu shot- Every fall.  Tetanus- Every 10 years.  Zostavax- Once after the age of 60 to prevent Shingles.  Pneumonia shot- Once after the age of 65; if you are younger than 65, ask your healthcare provider if you need a Pneumonia shot.  Take these steps  Don't smoke- If you do smoke, talk to your doctor about quitting.  For tips on how to quit, go to www.smokefree.gov or call 1-800-QUIT-NOW.  Be physically active- Exercise 5 days a week for at least 30 minutes.  If you are not already physically active start slow and gradually work up to 30 minutes of moderate physical activity.  Examples of moderate activity include walking briskly, mowing the yard, dancing, swimming, bicycling, etc.  Eat a healthy diet- Eat a variety of healthy food such as fruits, vegetables,  low fat milk, low fat cheese, yogurt, lean meant, poultry, fish, beans, tofu, etc. For more information go to www.thenutritionsource.org  Drink alcohol in moderation- Limit alcohol intake to less than two drinks a day. Never drink and drive.  Dentist- Brush and floss twice daily; visit your dentist twice a year.  Depression- Your emotional health is as important as your physical health. If you're feeling down, or losing interest in things you would normally enjoy please talk to your healthcare provider.  Eye exam- Visit your eye doctor every year.  Safe sex- If you may be exposed to a sexually transmitted infection, use a condom.  Seat belts- Seat belts can save your life; always wear one.  Smoke/Carbon Monoxide detectors- These detectors need to be installed on the appropriate level of your home.  Replace batteries at least once a year.  Skin cancer- When out in the sun, cover up and use sunscreen 15 SPF or higher.  Violence- If anyone is threatening you, please tell your healthcare provider.  Living Will/ Health care power of attorney- Speak with your healthcare provider and family.   IF you received an x-ray today, you will receive an invoice from [facility]. Please contact [facility] at [phone] with questions or concerns regarding your invoice.   IF you received labwork today, you will receive an invoice from [facility]. Please contact [facility] at [phone] with questions or concerns regarding your invoice.   Our billing staff will not be able to assist you with questions regarding bills from these companies.  You will be contacted   with the lab results as soon as they are available. The fastest way to get your results is to activate your My Chart account. Instructions are located on the last page of this paperwork. If you have not heard from us regarding the results in 2 weeks, please contact this office.     

## 2017-01-06 NOTE — Patient Instructions (Signed)
Dr Croitoru recommends that you continue on your current medications as directed. Please refer to the Current Medication list given to you today.  Your physician recommends that you return for lab work at your earliest convenience - FASTING.  Dr Croitoru recommends that you schedule a follow-up appointment in 1 year. You will receive a reminder letter in the mail two months in advance. If you don't receive a letter, please call our office to schedule the follow-up appointment.  If you need a refill on your cardiac medications before your next appointment, please call your pharmacy. 

## 2017-01-06 NOTE — Progress Notes (Signed)
Cardiology Office Note    Date:  01/06/2017   ID:  Jose Bright, DOB 07-19-1956, MRN [id number]  PCP:  Tally Due, MD  Cardiologist:   Thurmon Fair, MD   Chief Complaint  Patient presents with  . Follow-up    stress test results     History of Present Illness:  Jose Bright is a 61 y.o. male these requiring placement of a drug-eluting stent to the mid LAD artery in 2007, ongoing tobacco abuse, hyperlipidemia and hypertension.   He feels great, continues to work full time and denies angina, dizziness, syncope, palpitations, leg edema, claudication or focal neurological complaints. He has mild shortness of breath on exertion which is not new. He has tried to cut back on cigarette smoking and is now down to one pack every 2 weeks.  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 Stress tests myocardial perfusion imaging in 2012, 2014 and 2016 showed normal findings, but this year his test shows "intermediate risk".   There is a reduction in left ventricular systolic function to 48% and a mild fixed inferior perfusion defect. That same inferior defect has been present in the past and interpreted as representing diaphragmatic attenuation artifact. Reversible ischemia was not seen on any of these tests, including no evidence of ischemia this year. Exercise capacity was unchanged (10 minutes this year, 9 minutes in 2016), but the current ECG showed 3 mm ST segment depression which is new compared to the previous tests.  On my review of the nuclear images, I see no change whatsoever from previous evaluations, except for the lower EF. I agreed that the stress EKG changes are new.  Past Medical History:  Diagnosis Date  . CAD (coronary artery disease)   . Hyperlipidemia   . Systemic hypertension   . Tobacco abuse     Past Surgical History:  Procedure Laterality Date  . CORONARY ANGIOPLASTY WITH STENT PLACEMENT   05/31/2006   drug eluting stent LAD  . Exercise Treadmill Test  02/04/2013   No significant ischemia  . Nuclear Stress Test  06/07/2011   No ischemia    Current Medications: Outpatient Medications Prior to Visit  Medication Sig Dispense Refill  . atorvastatin (LIPITOR) 40 MG tablet Take 1 tablet (40 mg total) by mouth daily. 90 tablet 0  . fluticasone (FLONASE) 50 MCG/ACT nasal spray Place 1 spray into both nostrils daily.  5  . lisinopril (PRINIVIL,ZESTRIL) 20 MG tablet Take 1 tablet (20 mg total) by mouth daily. 90 tablet 0  . nitroGLYCERIN (NITROSTAT) 0.4 MG SL tablet Place 0.4 mg under the tongue every 5 (five) minutes as needed for chest pain.     No facility-administered medications prior to visit.      Allergies:   Patient has no known allergies.   Social History   Social History  . Marital status: Divorced    Spouse name: N/A  . Number of children: N/A  . Years of education: N/A   Social History Main Topics  . Smoking status: Current Every Day Smoker    Packs/day: 0.75    Types: Cigarettes  . Smokeless tobacco: Former Neurosurgeon  . Alcohol use 4.2 - 8.4 oz/week    7 - 14 Standard drinks or equivalent per week  . Drug use: No  . Sexual activity: Not Asked   Other Topics Concern  . None   Social History Narrative  . None     Family  History:  The patient's family history includes Heart attack in his father; Heart failure in his father; Hypertension in his father.   ROS:   Please see the history of present illness.    ROS All other systems reviewed and are negative.   PHYSICAL EXAM:   VS:  BP 110/80   Pulse 62   Ht  (1.778 m)   Wt 87.5 kg (193 lb)   BMI 27.69 kg/m    GEN: Well nourished, well developed, in no acute distress  HEENT: normal  Neck: no JVD, carotid bruits, or masses Cardiac: RRR; no murmurs, rubs, or gallops,no edema  Respiratory:  clear to auscultation bilaterally, normal work of breathing GI: soft, nontender, nondistended, + BS MS: no  deformity or atrophy  Skin: warm and dry, no rash Neuro:  Alert and Oriented x 3, Strength and sensation are intact Psych: euthymic mood, full affect  Wt Readings from Last 3 Encounters:  01/06/17 87.5 kg (193 lb)  01/05/17 82.6 kg (182 lb)  01/15/16 82.9 kg (182 lb 12.8 oz)      Studies/Labs Reviewed:   EKG:  EKG is not ordered today.  The baseline ECG on yesterday's stress test was normal.  Recent Labs: No results found for requested labs within last 8760 hours.   Lipid Panel    Component Value Date/Time   CHOL 129 01/30/2015 1604   TRIG 75 01/30/2015 1604   HDL 41 01/30/2015 1604   CHOLHDL 3.1 01/30/2015 1604   VLDL 15 01/30/2015 1604   LDLCALC 73 01/30/2015 1604    ASSESSMENT:    1. Coronary artery disease involving native coronary artery of native heart without angina pectoris   2. Dyslipidemia   3. Essential hypertension   4. Tobacco abuse   5. Medication management      PLAN:  In order of problems listed above:  1. CAD: He is completely asymptomatic and has no change in exercise capacity. The stress ECG response was abnormal and different from previous tests, but the perfusion images were completely unchanged. I doubt the significance of the lower LVEF. 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 As always, have encouraged him to promptly report any exertional symptoms and to promptly seek medical attention if he has chest discomfort that lasts for more than 30 minutes. He should be taking aspirin 81 mg once daily and I instructed him to restart this. 2. HLP: Excellent lipid profile (retest today). Borderline glucose but not fully fasting 3. HTN: Well controlled 4. Tobacco abuse: Strongly encouraged to quit smoking completely. He does not appear to have nicotine dependency.    Medication Adjustments/Labs and Tests Ordered: Current medicines are reviewed at length with the patient today.  Concerns regarding  medicines are outlined above.  Medication changes, Labs and Tests ordered today are listed in the Patient Instructions below. Patient Instructions  Dr Royann Shivers recommends that you continue on your current medications as directed. Please refer to the Current Medication list given to you today.  Your physician recommends that you return for lab work at your earliest convenience - FASTING.  Dr Royann Shivers recommends that you schedule a follow-up appointment in 1 year. You will receive a reminder letter in the mail two months in advance. If you don't receive a letter, please call our office to schedule the follow-up appointment.  If you need a refill on your cardiac medications before your next appointment, please call your pharmacy.    Signed,  Thurmon Fair, MD  01/06/2017 9:12 AM    [facility] [address] [place], [place], [place]  [address] Phone: [phone]; Fax: [phone]

## 2017-01-06 NOTE — Progress Notes (Signed)
  Commercial Driver Medical Examination   Jose Bright is a 61 y.o. male who presents today for a DOT physical exam. The patient reports history of CAD, had stent placed in 2007. Has nitroglycerin SL as needed, Has follow up with Dr. Royann Shivers. Stress test was done 01/05/2017, f/u was 01/06/2017. 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 Manages HTN with lisinopril, HL with atorvastatin. He is down to smoking 1 pack every 2 weeks. Denies dizziness, chronic headache, blurred vision, chest pain, shortness of breath, heart racing, palpitations, nausea, vomiting, abdominal pain, hematuria, lower leg swelling.   The following portions of the patient's history were reviewed and updated as appropriate: allergies, current medications, past family history, past medical history, past social history and past surgical history.  Objective:   BP 128/77   Pulse 60   Temp 97.9 F (36.6 C) (Oral)   Resp 14   Ht 5' 10.5" (1.791 m)   Wt 186 lb (84.4 kg)   SpO2 97%   BMI 26.31 kg/m   Vision/hearing:  Visual Acuity Screening   Right eye Left eye Both eyes  Without correction:  With correction:     Comments: Colors 6/6 Titmus 85% Bilateral, non corrected  Hearing Screening Comments: Whisper test 23ft  Patient can recognize and distinguish among traffic control signals and devices showing standard red, green, and amber colors.  Corrective lenses required: No  Monocular Vision?: No  Hearing aid requirement: No  Physical Exam  Constitutional: He is oriented to person, place, and time. He appears well-developed and well-nourished.  HENT:  TM's intact bilaterally, no effusions or erythema. Nasal turbinates pink and moist, nasal passages patent. No sinus tenderness. Oropharynx clear, mucous membranes moist.  Eyes: Conjunctivae and EOM are normal. Pupils are equal, round, and reactive to light. Right eye exhibits no discharge. Left eye exhibits no discharge. No  scleral icterus.  Neck: Normal range of motion. Neck supple. No JVD present.  Cardiovascular: Normal rate, regular rhythm and intact distal pulses.  Exam reveals no gallop and no friction rub.   No murmur heard. Pulmonary/Chest: No respiratory distress. He has no wheezes. He has no rales.  Abdominal: Soft. Bowel sounds are normal. He exhibits no distension and no mass. There is no tenderness.  Musculoskeletal: Normal range of motion. He exhibits no edema or tenderness.  Lymphadenopathy:    He has no cervical adenopathy.  Neurological: He is alert and oriented to person, place, and time. He has normal reflexes. He displays normal reflexes. Coordination normal.  Skin: Skin is warm and dry. Capillary refill takes less than 2 seconds. No rash noted. No erythema. No pallor.  Psychiatric: He has a normal mood and affect.   Labs: Comments: Urinalysis: Glucose-Negative, Blood-Negative, Protein-Trace, SG-1.005  Assessment:    Healthy male exam.  Meets standards, but periodic monitoring required due to CAD, HTN, HL.  Driver qualified only for 1 year.    Plan:   Medical examiners certificate completed and printed. Return as needed.  Wallis Bamberg, PA-C [facility] [phone] 01/06/2017  11:10 AM

## 2017-02-25 ENCOUNTER — Encounter: Payer: Self-pay | Admitting: Family Medicine

## 2017-02-25 ENCOUNTER — Ambulatory Visit (INDEPENDENT_AMBULATORY_CARE_PROVIDER_SITE_OTHER): Payer: BLUE CROSS/BLUE SHIELD | Admitting: Family Medicine

## 2017-02-25 VITALS — BP 140/83 | HR 95 | Temp 98.0°F | Resp 16 | Ht 71.0 in | Wt 189.0 lb

## 2017-02-25 DIAGNOSIS — F17200 Nicotine dependence, unspecified, uncomplicated: Secondary | ICD-10-CM

## 2017-02-25 DIAGNOSIS — J320 Chronic maxillary sinusitis: Secondary | ICD-10-CM | POA: Diagnosis not present

## 2017-02-25 MED ORDER — FLUTICASONE PROPIONATE 50 MCG/ACT NA SUSP: 2.0000 | Freq: Every day | NASAL | 6 refills | Status: DC

## 2017-02-25 MED ORDER — AMOXICILLIN-POT CLAVULANATE 875-125 MG PO TABS: 1.0000 | ORAL_TABLET | Freq: Two times a day (BID) | ORAL | 0 refills | Status: DC

## 2017-02-25 NOTE — Addendum Note (Signed)
Addended by: Collie SiadSTALLINGS, Patrisha Hausmann A on: 02/25/2017 03:26 PM   Modules accepted: Orders

## 2017-02-25 NOTE — Patient Instructions (Addendum)
   IF you received an x-ray today, you will receive an invoice from [facility]. Please contact [facility] at [phone] with questions or concerns regarding your invoice.   IF you received labwork today, you will receive an invoice from [facility]. Please contact [facility] at [phone] with questions or concerns regarding your invoice.   Our billing staff will not be able to assist you with questions regarding bills from these companies.  You will be contacted with the lab results as soon as they are available. The fastest way to get your results is to activate your My Chart account. Instructions are located on the last page of this paperwork. If you have not heard from us regarding the results in 2 weeks, please contact this office.      Sinusitis, Adult Sinusitis is soreness and inflammation of your sinuses. Sinuses are hollow spaces in the bones around your face. Your sinuses are located:  Around your eyes.  In the middle of your forehead.  Behind your nose.  In your cheekbones. Your sinuses and nasal passages are lined with a stringy fluid (mucus). Mucus normally drains out of your sinuses. When your nasal tissues become inflamed or swollen, the mucus can become trapped or blocked so air cannot flow through your sinuses. This allows bacteria, viruses, and funguses to grow, which leads to infection. Sinusitis can develop quickly and last for 7?10 days (acute) or for more than 12 weeks (chronic). Sinusitis often develops after a cold. What are the causes? This condition is caused by anything that creates swelling in the sinuses or stops mucus from draining, including:  Allergies.  Asthma.  Bacterial or viral infection.  Abnormally shaped bones between the nasal passages.  Nasal growths that contain mucus (nasal polyps).  Narrow sinus openings.  Pollutants, such as chemicals or irritants in the air.  A foreign object stuck in the nose.  A fungal  infection. This is rare. What increases the risk? The following factors may make you more likely to develop this condition:  Having allergies or asthma.  Having had a recent cold or respiratory tract infection.  Having structural deformities or blockages in your nose or sinuses.  Having a weak immune system.  Doing a lot of swimming or diving.  Overusing nasal sprays.  Smoking. What are the signs or symptoms? The main symptoms of this condition are pain and a feeling of pressure around the affected sinuses. Other symptoms include:  Upper toothache.  Earache.  Headache.  Bad breath.  Decreased sense of smell and taste.  A cough that may get worse at night.  Fatigue.  Fever.  Thick drainage from your nose. The drainage is often green and it may contain pus (purulent).  Stuffy nose or congestion.  Postnasal drip. This is when extra mucus collects in the throat or back of the nose.  Swelling and warmth over the affected sinuses.  Sore throat.  Sensitivity to light. How is this diagnosed? This condition is diagnosed based on symptoms, a medical history, and a physical exam. To find out if your condition is acute or chronic, your health care provider may:  Look in your nose for signs of nasal polyps.  Tap over the affected sinus to check for signs of infection.  View the inside of your sinuses using an imaging device that has a light attached (endoscope). If your health care provider suspects that you have chronic sinusitis, you may also:  Be tested for allergies.  Have a sample of   mucus taken from your nose (nasal culture) and checked for bacteria.  Have a mucus sample examined to see if your sinusitis is related to an allergy. If your sinusitis does not respond to treatment and it lasts longer than 8 weeks, you may have an MRI or CT scan to check your sinuses. These scans also help to determine how severe your infection is. In rare cases, a bone biopsy may  be done to rule out more serious types of fungal sinus disease. How is this treated? Treatment for sinusitis depends on the cause and whether your condition is chronic or acute. If a virus is causing your sinusitis, your symptoms will go away on their own within 10 days. You may be given medicines to relieve your symptoms, including:  Topical nasal decongestants. They shrink swollen nasal passages and let mucus drain from your sinuses.  Antihistamines. These drugs block inflammation that is triggered by allergies. This can help to ease swelling in your nose and sinuses.  Topical nasal corticosteroids. These are nasal sprays that ease inflammation and swelling in your nose and sinuses.  Nasal saline washes. These rinses can help to get rid of thick mucus in your nose. If your condition is caused by bacteria, you will be given an antibiotic medicine. If your condition is caused by a fungus, you will be given an antifungal medicine. Surgery may be needed to correct underlying conditions, such as narrow nasal passages. Surgery may also be needed to remove polyps. Follow these instructions at home: Medicines   Take, use, or apply over-the-counter and prescription medicines only as told by your health care provider. These may include nasal sprays.  If you were prescribed an antibiotic medicine, take it as told by your health care provider. Do not stop taking the antibiotic even if you start to feel better. Hydrate and Humidify   Drink enough water to keep your urine clear or pale yellow. Staying hydrated will help to thin your mucus.  Use a cool mist humidifier to keep the humidity level in your home above 50%.  Inhale steam for 10-15 minutes, 3-4 times a day or as told by your health care provider. You can do this in the bathroom while a hot shower is running.  Limit your exposure to cool or dry air. Rest   Rest as much as possible.  Sleep with your head raised (elevated).  Make sure to  get enough sleep each night. General instructions   Apply a warm, moist washcloth to your face 3-4 times a day or as told by your health care provider. This will help with discomfort.  Wash your hands often with soap and water to reduce your exposure to viruses and other germs. If soap and water are not available, use hand sanitizer.  Do not smoke. Avoid being around people who are smoking (secondhand smoke).  Keep all follow-up visits as told by your health care provider. This is important. Contact a health care provider if:  You have a fever.  Your symptoms get worse.  Your symptoms do not improve within 10 days. Get help right away if:  You have a severe headache.  You have persistent vomiting.  You have pain or swelling around your face or eyes.  You have vision problems.  You develop confusion.  Your neck is stiff.  You have trouble breathing. This information is not intended to replace advice given to you by your health care provider. Make sure you discuss any questions you have with   your health care provider. Document Released: 09/19/2005 Document Revised: 05/15/2016 Document Reviewed: 07/15/2015 Elsevier Interactive Patient Education  2017 [employer]  

## 2017-02-25 NOTE — Progress Notes (Signed)
  Chief Complaint  Patient presents with  . Sinus Problem    X 2 weeks    HPI   Pt with 2 weeks of right sinus pain with pressure behind the ears, the jaw, the eyes He denies fevers or chills   He reports that he had a similar episode 3 years ago He smokes 0.75 of a pack daily He is trying to cut down on his smoking He has a cough that is dry because of all his congestion  Past Medical History:  Diagnosis Date  . CAD (coronary artery disease)   . Hyperlipidemia   . Systemic hypertension   . Tobacco abuse     Current Outpatient Prescriptions  Medication Sig Dispense Refill  . atorvastatin (LIPITOR) 40 MG tablet Take 1 tablet (40 mg total) by mouth daily. 90 tablet 0  . lisinopril (PRINIVIL,ZESTRIL) 20 MG tablet Take 1 tablet (20 mg total) by mouth daily. 90 tablet 0  . nitroGLYCERIN (NITROSTAT) 0.4 MG SL tablet Place 0.4 mg under the tongue every 5 (five) minutes as needed for chest pain.    Marland Kitchen. amoxicillin-clavulanate (AUGMENTIN) 875-125 MG tablet Take 1 tablet by mouth 2 (two) times daily. 20 tablet 0   No current facility-administered medications for this visit.     Allergies: No Known Allergies  Past Surgical History:  Procedure Laterality Date  . CORONARY ANGIOPLASTY WITH STENT PLACEMENT  05/31/2006   drug eluting stent LAD  . Exercise Treadmill Test  02/04/2013   No significant ischemia  . Nuclear Stress Test  06/07/2011   No ischemia    Social History   Social History  . Marital status: Divorced    Spouse name: N/A  . Number of children: N/A  . Years of education: N/A   Social History Main Topics  . Smoking status: Current Every Day Smoker    Packs/day: 0.75    Types: Cigarettes  . Smokeless tobacco: Former NeurosurgeonUser  . Alcohol use 4.2 - 8.4 oz/week    7 - 14 Standard drinks or equivalent per week  . Drug use: No  . Sexual activity: Not Asked   Other Topics Concern  . None   Social History Narrative  . None    ROS See hpi  Objective: Vitals:   02/25/17 1418  BP: 140/83  Pulse: 95  Resp: 16  Temp: 98 F (36.7 C)  TempSrc: Oral  SpO2: 97%  Weight: 189 lb (85.7 kg)  Height: 5\' 11"  (1.803 m)    Physical Exam General: alert, oriented, in NAD Head: normocephalic, atraumatic, + right maxillary sinus tenderness Eyes: EOM intact, no scleral icterus or conjunctival injection Ears: TM clear bilaterally Nose: mucosa nonerythematous, nonedematous Throat: no pharyngeal exudate or erythema Lymph: no posterior auricular, submental or cervical lymph adenopathy Heart: normal rate, normal sinus rhythm, no murmurs Lungs: clear to auscultation bilaterally, no wheezing   Assessment and Plan [name] was seen today for sinus problem.  Diagnoses and all orders for this visit:  Chronic maxillary sinusitis -     amoxicillin-clavulanate (AUGMENTIN) 875-125 MG tablet; Take 1 tablet by mouth 2 (two) times daily.   Tobacco use disorder- still smoking       Rudell Marlowe A Bowman Higbie

## 2017-03-07 ENCOUNTER — Telehealth: Payer: Self-pay | Admitting: Family Medicine

## 2017-03-07 NOTE — Telephone Encounter (Signed)
EPIC kicked me out in the middle of putting in the patients message and will not let me in to edit it so there are going to be two messages.  Patient stated he has finished his medicine for his sinus infection and is better but not 100% wanted to know if he needed to come in and be seen again or if he could get a refill on his antibiotics.  [facility] uses [facility] on [address] in [place] and his call back number is [phone]

## 2017-03-07 NOTE — Telephone Encounter (Deleted)
[facility] [place]   [phone]

## 2017-03-09 NOTE — Telephone Encounter (Signed)
lmtcb See us if new fever or worse symtoms

## 2017-03-17 DIAGNOSIS — J0101 Acute recurrent maxillary sinusitis: Secondary | ICD-10-CM | POA: Diagnosis not present

## 2017-03-17 DIAGNOSIS — J0121 Acute recurrent ethmoidal sinusitis: Secondary | ICD-10-CM | POA: Diagnosis not present

## 2017-03-17 DIAGNOSIS — Z6826 Body mass index (BMI) 26.0-26.9, adult: Secondary | ICD-10-CM | POA: Diagnosis not present

## 2017-03-17 DIAGNOSIS — F17210 Nicotine dependence, cigarettes, uncomplicated: Secondary | ICD-10-CM | POA: Diagnosis not present

## 2017-04-13 ENCOUNTER — Other Ambulatory Visit: Payer: Self-pay | Admitting: Cardiovascular Disease

## 2017-04-13 ENCOUNTER — Other Ambulatory Visit: Payer: Self-pay

## 2017-04-13 MED ORDER — LISINOPRIL 20 MG PO TABS: 20.0000 mg | ORAL_TABLET | Freq: Every day | ORAL | 2 refills | Status: DC

## 2017-11-02 ENCOUNTER — Other Ambulatory Visit: Payer: Self-pay | Admitting: Cardiovascular Disease

## 2017-11-03 NOTE — Telephone Encounter (Signed)
REFILL 

## 2017-12-25 ENCOUNTER — Ambulatory Visit: Payer: Self-pay | Admitting: Physician Assistant

## 2017-12-25 ENCOUNTER — Encounter: Payer: Self-pay | Admitting: Cardiovascular Disease

## 2017-12-25 ENCOUNTER — Ambulatory Visit: Payer: BLUE CROSS/BLUE SHIELD | Admitting: Cardiovascular Disease

## 2017-12-25 ENCOUNTER — Encounter: Payer: Self-pay | Admitting: Physician Assistant

## 2017-12-25 ENCOUNTER — Other Ambulatory Visit: Payer: Self-pay

## 2017-12-25 VITALS — BP 129/75 | HR 81 | Ht 70.0 in | Wt 188.0 lb

## 2017-12-25 VITALS — BP 122/66 | HR 71 | Temp 98.1°F | Resp 17 | Ht 70.0 in | Wt 189.4 lb

## 2017-12-25 DIAGNOSIS — I2510 Atherosclerotic heart disease of native coronary artery without angina pectoris: Secondary | ICD-10-CM | POA: Diagnosis not present

## 2017-12-25 DIAGNOSIS — F17200 Nicotine dependence, unspecified, uncomplicated: Secondary | ICD-10-CM | POA: Diagnosis not present

## 2017-12-25 DIAGNOSIS — I10 Essential (primary) hypertension: Secondary | ICD-10-CM | POA: Diagnosis not present

## 2017-12-25 DIAGNOSIS — E785 Hyperlipidemia, unspecified: Secondary | ICD-10-CM

## 2017-12-25 DIAGNOSIS — Z79899 Other long term (current) drug therapy: Secondary | ICD-10-CM

## 2017-12-25 DIAGNOSIS — Z024 Encounter for examination for driving license: Secondary | ICD-10-CM

## 2017-12-25 NOTE — Progress Notes (Signed)
PRIMARY CARE AT [facility] [address] [place] [address] [phone]  Date:  12/25/2017   Name:  Jimmie Rueter   DOB:  [birth year]   MRN:  [id number]  PCP:  Patient, No Pcp Per    History of Present Illness:  Rowland Ericsson is a 62 y.o. male patient who presents to PCP with  Chief Complaint  Patient presents with  . DOT     No complaints or concerns at this time. Perfusion testing performed 2018.    Patient Active Problem List   Diagnosis Date Noted  . CAD (coronary artery disease) 02/28/2013  . Tobacco abuse   . Hyperlipidemia   . Systemic hypertension     Past Medical History:  Diagnosis Date  . CAD (coronary artery disease)   . Hyperlipidemia   . Systemic hypertension   . Tobacco abuse     Past Surgical History:  Procedure Laterality Date  . CORONARY ANGIOPLASTY WITH STENT PLACEMENT  05/31/2006   drug eluting stent LAD  . Exercise Treadmill Test  02/04/2013   No significant ischemia  . Nuclear Stress Test  06/07/2011   No ischemia    Social History   Tobacco Use  . Smoking status: Current Every Day Smoker    Packs/day: 0.75    Types: Cigarettes  . Smokeless tobacco: Former Engineer, water Use Topics  . Alcohol use: Yes    Alcohol/week: 4.2 - 8.4 oz    Types: 7 - 14 Standard drinks or equivalent per week  . Drug use: No    Family History  Problem Relation Age of Onset  . Hypertension Father   . Heart attack Father   . Heart failure Father     No Known Allergies  Medication list has been reviewed and updated.  Current Outpatient Medications on File Prior to Visit  Medication Sig Dispense Refill  . aspirin EC 81 MG tablet Take 81 mg by mouth daily.    Marland Kitchen atorvastatin (LIPITOR) 40 MG tablet TAKE 1 TABLET (40 MG TOTAL) BY MOUTH DAILY. 90 tablet 0  . lisinopril (PRINIVIL,ZESTRIL) 20 MG tablet TAKE 1 TABLET BY MOUTH EVERY DAY 90 tablet 0  . fluticasone (FLONASE) 50 MCG/ACT nasal spray Place 2 sprays into both nostrils daily. (Patient not taking:  Reported on 12/25/2017) 16 g 6  . nitroGLYCERIN (NITROSTAT) 0.4 MG SL tablet Place 0.4 mg under the tongue every 5 (five) minutes as needed for chest pain.     No current facility-administered medications on file prior to visit.     Review of Systems  Constitutional: Negative for chills and fever.  HENT: Negative for ear discharge, ear pain and sore throat.   Eyes: Negative for blurred vision and double vision.  Respiratory: Negative for cough, shortness of breath and wheezing.   Cardiovascular: Negative for chest pain, palpitations and leg swelling.  Gastrointestinal: Negative for diarrhea, nausea and vomiting.  Genitourinary: Negative for dysuria, frequency and hematuria.  Skin: Negative for itching and rash.  Neurological: Negative for dizziness and headaches.   ROS otherwise unremarkable unless listed above.  Physical Examination: BP 122/66 (BP Location: Left Arm, Patient Position: Sitting, Cuff Size: Large)   Pulse 71   Temp 98.1 F (36.7 C) (Oral)   Resp 17   Ht 5\' 10"  (1.778 m)   Wt 189 lb 6.4 oz (85.9 kg)   SpO2 95%   BMI 27.18 kg/m  Ideal Body Weight: Weight in (lb) to have BMI = 25: 173.9  Physical Exam  Constitutional:  He is oriented to person, place, and time. He appears well-developed and well-nourished. No distress.  HENT:  Head: Normocephalic and atraumatic.  Right Ear: Tympanic membrane, external ear and ear canal normal.  Left Ear: Tympanic membrane, external ear and ear canal normal.  Eyes: Pupils are equal, round, and reactive to light. Conjunctivae and EOM are normal.  Cardiovascular: Normal rate and regular rhythm. Exam reveals no friction rub.  No murmur heard. Pulmonary/Chest: Effort normal. No respiratory distress. He has no wheezes.  Abdominal: Soft. Bowel sounds are normal. He exhibits no distension and no mass. There is no tenderness. Hernia confirmed negative in the right inguinal area and confirmed negative in the left inguinal area.   Musculoskeletal: Normal range of motion. He exhibits no edema or tenderness.  Neurological: He is alert and oriented to person, place, and time. He displays normal reflexes.  Skin: Skin is warm and dry. He is not diaphoretic.  Psychiatric: He has a normal mood and affect. His behavior is normal.     Assessment and Plan: Maurene CapesDwight Pavlich is a 62 y.o. male who is here today for cc of  Chief Complaint  Patient presents with  . DOT  1 year certification obtained. 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

## 2017-12-25 NOTE — Patient Instructions (Signed)
     IF you received an x-ray today, you will receive an invoice from [facility]. Please contact [facility] at [phone] with questions or concerns regarding your invoice.   IF you received labwork today, you will receive an invoice from [facility]. Please contact [facility] at [phone] with questions or concerns regarding your invoice.   Our billing staff will not be able to assist you with questions regarding bills from these companies.  You will be contacted with the lab results as soon as they are available. The fastest way to get your results is to activate your My Chart account. Instructions are located on the last page of this paperwork. If you have not heard from us regarding the results in 2 weeks, please contact this office.     

## 2017-12-25 NOTE — Progress Notes (Signed)
Cardiology Office Note    Date:  12/25/2017   ID:  Jose Bright, DOB 1956-07-18, MRN [id number]  PCP:  Patient, No Pcp Per  Cardiologist:   Thurmon Fair, MD   Chief Complaint  Patient presents with  . 12 month f/u visit    pt states no Sx.    History of Present Illness:  Jose Bright is a 62 y.o. male these requiring placement of a drug-eluting stent to the mid LAD artery in 2007, ongoing tobacco abuse, hyperlipidemia and hypertension.    He feels great and does not have any cardiac complaints.  When asked about smoking he tells me that he has cut back to less than a pack per day (but a year ago he told me he was only smoking 1 pack every 2 weeks).  Attempts to quit smoking lead to a lot of anxiety.  The patient specifically denies any chest pain at rest exertion, dyspnea at rest or with exertion, orthopnea, paroxysmal nocturnal dyspnea, syncope, palpitations, focal neurological deficits, intermittent claudication, lower extremity edema, unexplained weight gain, cough, hemoptysis or wheezing.  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 Stress tests myocardial perfusion imaging in 2012, 2014 and 2016 showed normal findings, but is 2018 test showed "intermediate risk", due to a calculated EF of 48% and a mild fixed inferior perfusion defect.  In fact, the inferior perfusion defect is not new and has been previously described as diaphragmatic attenuation artifact, which I think it truly is.  He remains free of angina.  On sequential stress tests his activity level has been fairly constant at 9-10 minutes on the Bruce protocol.   Past Medical History:  Diagnosis Date  . CAD (coronary artery disease)   . Hyperlipidemia   . Systemic hypertension   . Tobacco abuse     Past Surgical History:  Procedure Laterality Date  . CORONARY ANGIOPLASTY WITH STENT PLACEMENT  05/31/2006   drug eluting stent LAD  . Exercise Treadmill  Test  02/04/2013   No significant ischemia  . Nuclear Stress Test  06/07/2011   No ischemia    Current Medications: Outpatient Medications Prior to Visit  Medication Sig Dispense Refill  . aspirin EC 81 MG tablet Take 81 mg by mouth daily.    Marland Kitchen atorvastatin (LIPITOR) 40 MG tablet TAKE 1 TABLET (40 MG TOTAL) BY MOUTH DAILY. 90 tablet 0  . fluticasone (FLONASE) 50 MCG/ACT nasal spray Place 2 sprays into both nostrils daily. (Patient not taking: Reported on 12/25/2017) 16 g 6  . lisinopril (PRINIVIL,ZESTRIL) 20 MG tablet TAKE 1 TABLET BY MOUTH EVERY DAY 90 tablet 0  . nitroGLYCERIN (NITROSTAT) 0.4 MG SL tablet Place 0.4 mg under the tongue every 5 (five) minutes as needed for chest pain.    Marland Kitchen amoxicillin-clavulanate (AUGMENTIN) 875-125 MG tablet Take 1 tablet by mouth 2 (two) times daily. 20 tablet 0   No facility-administered medications prior to visit.      Allergies:   Patient has no known allergies.   Social History   Socioeconomic History  . Marital status: Divorced    Spouse name: Not on file  . Number of children: Not on file  . Years of education: Not on file  . Highest education level: Not on file  Occupational History  . Not on file  Social Needs  . Financial resource strain: Not on file  . Food insecurity:    Worry: Not on file  Inability: Not on file  . Transportation needs:    Medical: Not on file    Non-medical: Not on file  Tobacco Use  . Smoking status: Current Every Day Smoker    Packs/day: 0.75    Types: Cigarettes  . Smokeless tobacco: Former Engineer, waterUser  Substance and Sexual Activity  . Alcohol use: Yes    Alcohol/week: 4.2 - 8.4 oz    Types: 7 - 14 Standard drinks or equivalent per week  . Drug use: No  . Sexual activity: Not on file  Lifestyle  . Physical activity:    Days per week: Not on file    Minutes per session: Not on file  . Stress: Not on file  Relationships  . Social connections:    Talks on phone: Not on file    Gets together: Not on file      Attends religious service: Not on file    Active member of club or organization: Not on file    Attends meetings of clubs or organizations: Not on file    Relationship status: Not on file  Other Topics Concern  . Not on file  Social History Narrative  . Not on file     Family History:  The patient's family history includes Heart attack in his father; Heart failure in his father; Hypertension in his father.   ROS:   Please see the history of present illness.    ROS All other systems reviewed and are negative.   PHYSICAL EXAM:   VS:  BP 129/75   Pulse 81   Ht 5\' 10"  (1.778 m)   Wt 188 lb (85.3 kg)   BMI 26.98 kg/m     General: Alert, oriented x3, no distress, lean, appears comfortable Head: no evidence of trauma, PERRL, EOMI, no exophtalmos or lid lag, no myxedema, no xanthelasma; normal ears, nose and oropharynx Neck: normal jugular venous pulsations and no hepatojugular reflux; brisk carotid pulses without delay and no carotid bruits Chest: clear to auscultation, no signs of consolidation by percussion or palpation, normal fremitus, symmetrical and full respiratory excursions Cardiovascular: normal position and quality of the apical impulse, regular rhythm, normal first and second heart sounds, no murmurs, rubs or gallops Abdomen: no tenderness or distention, no masses by palpation, no abnormal pulsatility or arterial bruits, normal bowel sounds, no hepatosplenomegaly Extremities: no clubbing, cyanosis or edema; 2+ radial, ulnar and brachial pulses bilaterally; 2+ right femoral, posterior tibial and dorsalis pedis pulses; 2+ left femoral, posterior tibial and dorsalis pedis pulses; no subclavian or femoral bruits Neurological: grossly nonfocal Psych: Normal mood and affect   Wt Readings from Last 3 Encounters:  12/25/17 189 lb 6.4 oz (85.9 kg)  12/25/17 188 lb (85.3 kg)  02/25/17 189 lb (85.7 kg)      Studies/Labs Reviewed:   EKG:  EKG is ordered today.  His EKG is  completely normal; it shows mild sinus bradycardia, QT 396 ms    Recent Labs: Recent labs from primary care provider show glucose 95, creatinine 1.3, potassium 4.7, normal liver function tests Triglycerides 72, total cholesterol 134, HDL 57, LDL 63 Lipid Panel    Component Value Date/Time   CHOL 131 01/06/2017 0930   TRIG 53 01/06/2017 0930   HDL 53 01/06/2017 0930   CHOLHDL 2.5 01/06/2017 0930   VLDL 11 01/06/2017 0930   LDLCALC 67 01/06/2017 0930    ASSESSMENT:    1. Coronary artery disease involving native coronary artery of native heart without angina pectoris  2. Dyslipidemia   3. Essential hypertension   4. Smoking   5. Medication management      PLAN:  In order of problems listed above:  1. CAD: He remains asymptomatic and his exercise tolerance has been steady over the years.  It is quite possible that he has underlying CAD, but invasive evaluation does not appear necessary and I think he can continue driving.  I strongly encouraged him again to quit smoking.  His other risk factors are all generally well addressed.. 2. HLP: Excellent HDL and LDL cholesterol levels. 3. HTN: Well controlled 4. Tobacco abuse: Smoking cessation is definitely the single most important thing he can do to improve his health and his overall outlook over the years.  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  We will try to organize it just before his follow-up appointment.  Medication Adjustments/Labs and Tests Ordered: Current medicines are reviewed at length with the patient today.  Concerns regarding medicines are outlined above.  Medication changes, Labs and Tests ordered today are listed in the Patient Instructions below. Patient Instructions  Medication Instructions: Dr Royann Shivers recommends that you continue on your current medications as directed. Please refer to the Current Medication list given to you today.  Labwork: Your physician recommends that you  return for lab work TODAY.  Testing/Procedures: 1. Exercise Myoview Stress test - Your physician has requested that you have en exercise stress myoview. For further information please visit [email or web address] Please follow instruction sheet, as given.  Follow-up: Dr Royann Shivers recommends that you schedule a follow-up appointment in 12 months. Please call in JANUARY 2020 for an appointment in [facility]. You will receive a reminder letter in the mail two months in advance. If you don't receive a letter, please call our office to schedule the follow-up appointment.  If you need a refill on your cardiac medications before your next appointment, please call your pharmacy.    Signed, Thurmon Fair, MD  12/25/2017 5:52 PM    [facility] [address] [place], [place], [place]  [address] Phone: [phone]; Fax: [phone]

## 2017-12-25 NOTE — Patient Instructions (Addendum)
Medication Instructions: Dr Royann Shiversroitoru recommends that you continue on your current medications as directed. Please refer to the Current Medication list given to you today.  Labwork: Your physician recommends that you return for lab work TODAY.  Testing/Procedures: 1. Exercise Myoview Stress test - Your physician has requested that you have en exercise stress myoview. For further information please visit [email or web address] Please follow instruction sheet, as given.  Follow-up: Dr Royann Shiversroitoru recommends that you schedule a follow-up appointment in 12 months. Please call in JANUARY 2020 [facility]. You will receive a reminder letter in the mail two months in advance. If you don't receive a letter, please call our office to schedule the follow-up appointment.  If you need a refill on your cardiac medications before your next appointment, please call your pharmacy.

## 2017-12-26 ENCOUNTER — Telehealth: Payer: Self-pay | Admitting: Cardiovascular Disease

## 2017-12-26 LAB — COMPREHENSIVE METABOLIC PANEL
ALK PHOS: 55 IU/L (ref 39–117)
ALT: 18 IU/L (ref 0–44)
AST: 24 IU/L (ref 0–40)
Albumin/Globulin Ratio: 2.1 (ref 1.2–2.2)
Albumin: 4.6 g/dL (ref 3.6–4.8)
BILIRUBIN TOTAL: 1 mg/dL (ref 0.0–1.2)
BUN/Creatinine Ratio: 13 (ref 10–24)
BUN: 10 mg/dL (ref 8–27)
CHLORIDE: 99 mmol/L (ref 96–106)
CO2: 21 mmol/L (ref 20–29)
CREATININE: 0.75 mg/dL — AB (ref 0.76–1.27)
Calcium: 9.4 mg/dL (ref 8.6–10.2)
GFR calc Af Amer: 114 mL/min/{1.73_m2} (ref >59)
GFR calc non Af Amer: 98 mL/min/{1.73_m2} (ref >59)
GLUCOSE: 90 mg/dL (ref 65–99)
Globulin, Total: 2.2 g/dL (ref 1.5–4.5)
Potassium: 4.4 mmol/L (ref 3.5–5.2)
Sodium: 135 mmol/L (ref 134–144)
TOTAL PROTEIN: 6.8 g/dL (ref 6.0–8.5)

## 2017-12-26 LAB — LIPID PANEL
CHOLESTEROL TOTAL: 136 mg/dL (ref 100–199)
Chol/HDL Ratio: 2.8 ratio (ref 0.0–5.0)
HDL: 49 mg/dL (ref >39)
LDL CALC: 71 mg/dL (ref 0–99)
TRIGLYCERIDES: 79 mg/dL (ref 0–149)
VLDL CHOLESTEROL CAL: 16 mg/dL (ref 5–40)

## 2017-12-26 NOTE — Telephone Encounter (Signed)
New message ° °Pt verbalized that he is returning call for RN ° °For lab results °

## 2017-12-26 NOTE — Telephone Encounter (Signed)
Notes recorded by Thurmon Fairroitoru, Mihai, MD on 12/26/2017 at 9:04 AM EDT All labs are great  ** patient aware

## 2018-01-01 ENCOUNTER — Encounter: Payer: Self-pay | Admitting: Physician Assistant

## 2018-01-24 ENCOUNTER — Other Ambulatory Visit: Payer: Self-pay | Admitting: Cardiovascular Disease

## 2018-04-13 ENCOUNTER — Ambulatory Visit: Payer: Self-pay | Admitting: *Deleted

## 2018-04-13 NOTE — Telephone Encounter (Signed)
Pt reports Wednesday 04/11/18 noted small "Pea sized lump" in left testicle. Denies any pain, swelling, fever, dysuria. Pt states lump "Moves around" when palpated. No hematuria. Appt made with Marlowe ShoresM. Mani for Tuesday am per pts schedule. Care advise given per protocol; instructed to call back if pain, swelling, fever, hematuria, any changes occur.    Reason for Disposition . All other penis - scrotum symptoms  (Exception: painless rash < 24 hours duration)  Answer Assessment - Initial Assessment Questions 1. SYMPTOM: "What's the main symptom you're concerned about?" (e.g., discharge from penis, rash, pain, itching, swelling)    Pea sized lump in left testicle 2. LOCATION: "Where is the  located?"     Left testicle 3. ONSET: "When did   start?"     04/10/18 4. PAIN: "Is there any pain?" If so, ask: "How bad is it?"  (Scale 1-10; or mild, moderate, severe)     no 5. URINE: "Any difficulty passing urine?" If so, ask: "When was the last time?"     no 6. CAUSE: "What do you think is causing the symptoms?"     unsure 7. OTHER SYMPTOMS: "Do you have any other symptoms?" (e.g., fever, abdominal pain, blood in urine)     no  Protocols used: P[facility]IS AND [facility]CR[facility]UM [facility]

## 2018-04-17 ENCOUNTER — Ambulatory Visit: Payer: Self-pay | Admitting: Urgent Care

## 2018-04-19 ENCOUNTER — Encounter: Payer: Self-pay | Admitting: Urgent Care

## 2018-04-19 ENCOUNTER — Ambulatory Visit: Payer: BLUE CROSS/BLUE SHIELD | Admitting: Urgent Care

## 2018-04-19 VITALS — BP 142/82 | HR 68 | Temp 97.6°F | Resp 16 | Ht 70.0 in | Wt 190.2 lb

## 2018-04-19 DIAGNOSIS — R319 Hematuria, unspecified: Secondary | ICD-10-CM | POA: Diagnosis not present

## 2018-04-19 DIAGNOSIS — N5089 Other specified disorders of the male genital organs: Secondary | ICD-10-CM

## 2018-04-19 DIAGNOSIS — N509 Disorder of male genital organs, unspecified: Secondary | ICD-10-CM | POA: Diagnosis not present

## 2018-04-19 LAB — POCT URINALYSIS DIP (MANUAL ENTRY)
BILIRUBIN UA: NEGATIVE
BILIRUBIN UA: NEGATIVE mg/dL
GLUCOSE UA: NEGATIVE mg/dL
Leukocytes, UA: NEGATIVE
NITRITE UA: NEGATIVE
Protein Ur, POC: NEGATIVE mg/dL
Spec Grav, UA: 1.015 (ref 1.010–1.025)
Urobilinogen, UA: 0.2 E.U./dL
pH, UA: 5.5 (ref 5.0–8.0)

## 2018-04-19 NOTE — Progress Notes (Signed)
   MRN: [id number] DOB: 09/22/1956  Subjective:   Jose Bright is a 62 y.o. male presenting for 1 week history of left testicular mass.  Reports that he found it while showering.  Denies fever, nausea, vomiting, belly pain, dysuria, hematuria, penile discharge, tenderness, swelling.  Patient has a remote history of needing a consult with the urologist and states that everything turned out okay.  He describes what was probably an elevated PSA but reports that he was advised follow-up was not necessary.  Jose Bright has a current medication list which includes the following prescription(s): aspirin ec, atorvastatin, lisinopril, and nitroglycerin. Also has No Known Allergies.  Jose Bright  has a past medical history of CAD (coronary artery disease), Hyperlipidemia, Systemic hypertension, and Tobacco abuse. Also  has a past surgical history that includes Coronary angioplasty with stent (05/31/2006); Exercise Treadmill Test (02/04/2013); and Nuclear Stress Test (06/07/2011).  Objective:   Vitals: BP (!) 142/82   Pulse 68   Temp 97.6 F (36.4 C) (Oral)   Resp 16   Ht 5\' 10"  (1.778 m)   Wt 190 lb 3.2 oz (86.3 kg)   SpO2 96%   BMI 27.29 kg/m   Physical Exam  Constitutional: He is oriented to person, place, and time. He appears well-developed and well-nourished.  Cardiovascular: Normal rate.  Pulmonary/Chest: Effort normal.  Abdominal: Hernia confirmed negative in the right inguinal area and confirmed negative in the left inguinal area.  Genitourinary: Right testis shows no mass, no swelling and no tenderness. Right testis is descended. Left testis shows mass. Left testis shows no swelling and no tenderness. Left testis is descended. Circumcised. No phimosis, paraphimosis, hypospadias, penile erythema or penile tenderness. No discharge found.     Lymphadenopathy: No inguinal adenopathy noted on the right or left side.  Neurological: He is alert and oriented to person, place, and time.   Results for orders  placed or performed in visit on 04/19/18 (from the past 24 hour(s))  POCT urinalysis dipstick     Status: Abnormal   Collection Time: 04/19/18 10:40 AM  Result Value Ref Range   Color, UA yellow yellow   Clarity, UA clear clear   Glucose, UA negative negative mg/dL   Bilirubin, UA negative negative   Ketones, POC UA negative negative mg/dL   Spec Grav, UA 4.0981.015 1.1911.010 - 1.025   Blood, UA small (A) negative   pH, UA 5.5 5.0 - 8.0   Protein Ur, POC negative negative mg/dL   Urobilinogen, UA 0.2 0.2 or 1.0 E.U./dL   Nitrite, UA Negative Negative   Leukocytes, UA Negative Negative   Assessment and Plan :   Testicular mass - Plan: POCT urinalysis dipstick, US Scrotum, PSA, CBC  Hematuria, unspecified type - Plan: Urine Microscopic  Will pursue PSA level, testicular ultrasound.  Patient would like to have referral back to urology for recheck on his prostate as well.  Labs pending.  Wallis BambergMario Natalye Kott, PA-C [facility] [phone] 04/19/2018  10:34 AM

## 2018-04-19 NOTE — Patient Instructions (Addendum)
We will schedule a testicular ultrasound to further evaluate the mass.  Our staff will give you a call when trying to schedule this ultrasound, keep in mind that due to the high volume of referrals we do it may take up to 2 weeks to be scheduled.  I am also going to do some blood work that has not yet been done which includes your blood cell counts and a prostate level.  The blood work he had recently with your heart doctor showed that you had good kidney function so there is no reason to repeat these levels.    Scrotal Swelling Scrotal swelling may occur on one or both sides of the scrotum. Pain may also occur with swelling. Possible causes of scrotal swelling include:  Injury.  Infection.  An ingrown hair or abrasion in the area.  Repeated rubbing from tight-fitting underwear.  Poor hygiene.  A weakened area in the muscles around the groin (hernia). A hernia can allow abdominal contents to push into the scrotum.  Fluid around the testicle (hydrocele).  Enlarged vein around the testicle (varicocele).  Certain medical treatments or existing conditions.  A recent genital surgery or procedure.  The spermatic cord becomes twisted in the scrotum, which cuts off blood supply (testicular torsion).  Testicular cancer.  Follow these instructions at home: Once the cause of your scrotal swelling has been determined, you may be asked to monitor your scrotum for any changes. The following actions may help to alleviate any discomfort you are experiencing:  Rest and limit activity until the swelling goes away. Lying down is the preferred position.  Put ice on the scrotum: ? Put ice in a plastic bag. ? Place a towel between your skin and the bag. ? Leave the ice on for 20 minutes, 2-3 times a day for 1-2 days.  Place a rolled towel under the testicles for support.  Wear loose-fitting clothing or an athletic support cup for comfort.  Take all medicines as directed by your health care  provider.  Perform a monthly self-exam of the scrotum and penis. Feel for changes. Ask your health care provider how to perform a monthly self-exam if you are unsure.  Contact a health care provider if:  You have a sudden (acute) onset of pain that is persistent and not improving.  You notice a heavy feeling or fluid in the scrotum.  You have pain or burning while urinating.  You have blood in the urine or semen.  You feel a lump around the testicle.  You notice that one testicle is larger than the other (slight variation is normal).  You have a persistent dull ache or pain in the groin or scrotum. Get help right away if:  The pain does not go away or becomes severe.  You have a fever or shaking chills.  You have pain or vomiting that cannot be controlled.  You notice significant redness or swelling of one or both sides of the scrotum.  You experience redness spreading upward from your scrotum to your abdomen or downward from your scrotum to your thighs. This information is not intended to replace advice given to you by your health care provider. Make sure you discuss any questions you have with your health care provider. Document Released: 10/22/2010 Document Revised: 04/08/2016 Document Reviewed: 02/21/2013 Elsevier Interactive Patient Education  2018 [employer]     IF you received an x-ray today, you will receive an invoice from [facility]. Please contact [facility] at [phone] with  questions or concerns regarding your invoice.   IF you received labwork today, you will receive an invoice from [place]. Please contact [facility] at [phone] with questions or concerns regarding your invoice.   Our billing staff will not be able to assist you with questions regarding bills from these companies.  You will be contacted with the lab results as soon as they are available. The fastest way to get your results is to activate your My Chart account.  Instructions are located on the last page of this paperwork. If you have not heard from [place] regarding the results in 2 weeks, please contact this office.

## 2018-04-20 LAB — CBC
HEMATOCRIT: 42.6 % (ref 37.5–51.0)
Hemoglobin: 14.6 g/dL (ref 13.0–17.7)
MCH: 32.3 pg (ref 26.6–33.0)
MCHC: 34.3 g/dL (ref 31.5–35.7)
MCV: 94 fL (ref 79–97)
Platelets: 270 10*3/uL (ref 150–450)
RBC: 4.52 x10E6/uL (ref 4.14–5.80)
RDW: 13.6 % (ref 12.3–15.4)
WBC: 6.9 10*3/uL (ref 3.4–10.8)

## 2018-04-20 LAB — URINALYSIS, MICROSCOPIC ONLY
Bacteria, UA: NONE SEEN
Casts: NONE SEEN /lpf
Epithelial Cells (non renal): NONE SEEN /hpf (ref 0–10)

## 2018-04-20 LAB — PSA: Prostate Specific Ag, Serum: 0.4 ng/mL (ref 0.0–4.0)

## 2018-04-30 ENCOUNTER — Ambulatory Visit
Admission: RE | Admit: 2018-04-30 | Discharge: 2018-04-30 | Disposition: A | Discharge status: Home / Self Care | Payer: BLUE CROSS/BLUE SHIELD | Source: Ambulatory Visit | Attending: Urgent Care | Admitting: Urgent Care

## 2018-04-30 DIAGNOSIS — N5089 Other specified disorders of the male genital organs: Secondary | ICD-10-CM

## 2018-04-30 DIAGNOSIS — N503 Cyst of epididymis: Secondary | ICD-10-CM | POA: Diagnosis not present

## 2018-05-04 ENCOUNTER — Telehealth: Payer: Self-pay | Admitting: Urgent Care

## 2018-05-04 NOTE — Telephone Encounter (Signed)
Copied from CRM [id number]. Topic: Quick Communication - Other Results >> May 03, 2018  5:42 PM Alexander BergeronBarksdale, Harvey B wrote: Reason for CRM: pt called to get image results; contact pt to advise

## 2018-05-05 NOTE — Telephone Encounter (Signed)
Discussed imaging results with patient.  We are going to pursue his referral to urology.  Please let me know what the status is of this.

## 2018-05-16 ENCOUNTER — Other Ambulatory Visit: Payer: Self-pay | Admitting: Cardiovascular Disease

## 2018-05-16 NOTE — Telephone Encounter (Signed)
Rx request sent to pharmacy.  

## 2018-05-19 ENCOUNTER — Other Ambulatory Visit: Payer: Self-pay | Admitting: Cardiovascular Disease

## 2018-11-16 DIAGNOSIS — H02832 Dermatochalasis of right lower eyelid: Secondary | ICD-10-CM | POA: Diagnosis not present

## 2018-11-16 DIAGNOSIS — H02834 Dermatochalasis of left upper eyelid: Secondary | ICD-10-CM | POA: Diagnosis not present

## 2018-11-16 DIAGNOSIS — H2513 Age-related nuclear cataract, bilateral: Secondary | ICD-10-CM | POA: Diagnosis not present

## 2018-11-16 DIAGNOSIS — H02831 Dermatochalasis of right upper eyelid: Secondary | ICD-10-CM | POA: Diagnosis not present

## 2018-12-11 ENCOUNTER — Telehealth (HOSPITAL_COMMUNITY): Payer: Self-pay

## 2018-12-11 NOTE — Telephone Encounter (Signed)
Encounter complete. 

## 2018-12-14 ENCOUNTER — Other Ambulatory Visit: Payer: Self-pay

## 2018-12-14 ENCOUNTER — Ambulatory Visit (HOSPITAL_COMMUNITY)
Admission: RE | Admit: 2018-12-14 | Discharge: 2018-12-14 | Disposition: A | Discharge status: Home / Self Care | Payer: BLUE CROSS/BLUE SHIELD | Source: Ambulatory Visit | Attending: Cardiology | Admitting: Cardiology

## 2018-12-14 DIAGNOSIS — I2510 Atherosclerotic heart disease of native coronary artery without angina pectoris: Secondary | ICD-10-CM | POA: Diagnosis not present

## 2018-12-14 LAB — MYOCARDIAL PERFUSION IMAGING
LV dias vol: 125 mL (ref 62–150)
LV sys vol: 60 mL
Peak HR: 88 {beats}/min
Rest HR: 59 {beats}/min
SDS: 3
SRS: 3
SSS: 5
TID: 1.17

## 2018-12-14 MED ORDER — REGADENOSON 0.4 MG/5ML IV SOLN
0.4000 mg | Freq: Once | INTRAVENOUS | Status: AC
  Administered 2018-12-14: 0.4 mg via INTRAVENOUS

## 2018-12-14 MED ORDER — TECHNETIUM TC 99M TETROFOSMIN IV KIT
30.1000 | PACK | Freq: Once | INTRAVENOUS | Status: AC | PRN
  Administered 2018-12-14: 30.1 via INTRAVENOUS
  Filled 2018-12-14: qty 31

## 2018-12-14 MED ORDER — TECHNETIUM TC 99M TETROFOSMIN IV KIT
9.7000 | PACK | Freq: Once | INTRAVENOUS | Status: AC | PRN
  Administered 2018-12-14: 9.7 via INTRAVENOUS
  Filled 2018-12-14: qty 10

## 2018-12-20 ENCOUNTER — Telehealth: Payer: Self-pay | Admitting: Cardiovascular Disease

## 2018-12-20 NOTE — Telephone Encounter (Signed)
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  Please give him a call about what he can do about his appt and working 12/26/18.  Pt stated he would like a copy of his Card from last time please give him a call.

## 2018-12-20 NOTE — Telephone Encounter (Signed)
OK to give him the letter and a copy of his stress test MCr

## 2018-12-20 NOTE — Telephone Encounter (Signed)
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 Will forward to dr croitoru for okay for letter.

## 2018-12-21 ENCOUNTER — Encounter: Payer: Self-pay | Admitting: *Deleted

## 2018-12-21 NOTE — Telephone Encounter (Signed)
Spoke with pt, copy of nuclear test and letter placed at the front desk for pick up.

## 2019-03-01 ENCOUNTER — Other Ambulatory Visit: Payer: Self-pay | Admitting: Cardiovascular Disease

## 2019-06-11 ENCOUNTER — Other Ambulatory Visit: Payer: Self-pay | Admitting: Cardiovascular Disease

## 2019-06-14 ENCOUNTER — Other Ambulatory Visit: Payer: Self-pay | Admitting: Cardiovascular Disease

## 2019-06-18 ENCOUNTER — Other Ambulatory Visit: Payer: Self-pay | Admitting: Cardiovascular Disease

## 2019-06-18 ENCOUNTER — Other Ambulatory Visit: Payer: Self-pay

## 2019-06-18 ENCOUNTER — Ambulatory Visit (INDEPENDENT_AMBULATORY_CARE_PROVIDER_SITE_OTHER): Payer: BC Managed Care – PPO | Admitting: Registered Nurse

## 2019-06-18 ENCOUNTER — Encounter: Payer: Self-pay | Admitting: Registered Nurse

## 2019-06-18 VITALS — BP 140/70 | HR 75 | Temp 98.0°F | Resp 16 | Wt 182.0 lb

## 2019-06-18 DIAGNOSIS — E7800 Pure hypercholesterolemia, unspecified: Secondary | ICD-10-CM | POA: Diagnosis not present

## 2019-06-18 DIAGNOSIS — R197 Diarrhea, unspecified: Secondary | ICD-10-CM

## 2019-06-18 DIAGNOSIS — Z130 Encounter for screening for diseases of the blood and blood-forming organs and certain disorders involving the immune mechanism: Secondary | ICD-10-CM

## 2019-06-18 DIAGNOSIS — I2510 Atherosclerotic heart disease of native coronary artery without angina pectoris: Secondary | ICD-10-CM | POA: Diagnosis not present

## 2019-06-18 DIAGNOSIS — R1084 Generalized abdominal pain: Secondary | ICD-10-CM

## 2019-06-18 DIAGNOSIS — Z1329 Encounter for screening for other suspected endocrine disorder: Secondary | ICD-10-CM

## 2019-06-18 DIAGNOSIS — Z1211 Encounter for screening for malignant neoplasm of colon: Secondary | ICD-10-CM

## 2019-06-18 DIAGNOSIS — Z13228 Encounter for screening for other metabolic disorders: Secondary | ICD-10-CM

## 2019-06-18 DIAGNOSIS — Z122 Encounter for screening for malignant neoplasm of respiratory organs: Secondary | ICD-10-CM

## 2019-06-18 NOTE — Progress Notes (Signed)
Established Patient Office Visit  Subjective:  Patient ID: Jose Bright, male    DOB: Jun 10, 1956  Age: 63 y.o. MRN: [id number]  CC:  Chief Complaint  Patient presents with  . Abdominal Pain    pt states he has been having some blotting and discomfort   . Diarrhea    HPI Jose Bright presents for abdominal discomfort and diarrhea x 5 weeks States that his stool has often been on the soft side- he admits his diet has been poor. He states that around 5 weeks ago, he started having almost exclusively liquid stool, occasionally there are some solids. Denies change in color. Denies blood in stool - either bright red or dark, tarry stool. He has no colonoscopy history. He has a 45 pack year history of smoking, possibly more. He states that his diet has changed: he is eating more meals prepared at home rather than eating out. He also notes generalized abdominal discomfort and tenderness. No particular location where it is worse. Feels bloated, but no tenesmus.   Past Medical History:  Diagnosis Date  . CAD (coronary artery disease)   . Hyperlipidemia   . Systemic hypertension   . Tobacco abuse     Past Surgical History:  Procedure Laterality Date  . CORONARY ANGIOPLASTY WITH STENT PLACEMENT  05/31/2006   drug eluting stent LAD  . Exercise Treadmill Test  02/04/2013   No significant ischemia  . Nuclear Stress Test  06/07/2011   No ischemia    Family History  Problem Relation Age of Onset  . Hypertension Father   . Heart attack Father   . Heart failure Father     Social History   Socioeconomic History  . Marital status: Divorced    Spouse name: Not on file  . Number of children: Not on file  . Years of education: Not on file  . Highest education level: Not on file  Occupational History  . Not on file  Social Needs  . Financial resource strain: Not on file  . Food insecurity    Worry: Not on file    Inability: Not on file  . Transportation needs    Medical: Not on file     Non-medical: Not on file  Tobacco Use  . Smoking status: Current Every Day Smoker    Packs/day: 0.75    Types: Cigarettes  . Smokeless tobacco: Former Network engineer and Sexual Activity  . Alcohol use: Yes    Alcohol/week: 7.0 - 14.0 standard drinks    Types: 7 - 14 Standard drinks or equivalent per week  . Drug use: No  . Sexual activity: Not on file  Lifestyle  . Physical activity    Days per week: Not on file    Minutes per session: Not on file  . Stress: Not on file  Relationships  . Social Herbalist on phone: Not on file    Gets together: Not on file    Attends religious service: Not on file    Active member of club or organization: Not on file    Attends meetings of clubs or organizations: Not on file    Relationship status: Not on file  . Intimate partner violence    Fear of current or ex partner: Not on file    Emotionally abused: Not on file    Physically abused: Not on file    Forced sexual activity: Not on file  Other Topics Concern  . Not on file  Social History Narrative  . Not on file    Outpatient Medications Prior to Visit  Medication Sig Dispense Refill  . aspirin EC 81 MG tablet Take 81 mg by mouth daily.    Marland Kitchen atorvastatin (LIPITOR) 40 MG tablet Take 1 tablet (40 mg total) by mouth daily. *PLEASE CALL TO SCHEDULE AN APPOINTMENT FOR FURTHER REFILLS* 30 tablet 0  . lisinopril (ZESTRIL) 20 MG tablet TAKE 1 TABLET BY MOUTH EVERY DAY 90 tablet 2  . nitroGLYCERIN (NITROSTAT) 0.4 MG SL tablet Place 0.4 mg under the tongue every 5 (five) minutes as needed for chest pain.     No facility-administered medications prior to visit.     No Known Allergies  ROS Review of Systems Per hpi   Objective:    Physical Exam  Constitutional: He appears well-developed and well-nourished. No distress.  Cardiovascular: Normal rate and regular rhythm.  Pulmonary/Chest: Effort normal. No respiratory distress.  Abdominal: Soft. Normal appearance. He  exhibits no distension and no mass. Bowel sounds are increased. There is no hepatosplenomegaly or hepatomegaly. There is generalized abdominal tenderness. There is no rigidity, no rebound and no guarding. Hernia confirmed negative in the ventral area.  Skin: He is not diaphoretic.  Nursing note and vitals reviewed.   BP 140/70   Pulse 75   Temp 98 F (36.7 C) (Oral)   Resp 16   Wt 182 lb (82.6 kg)   SpO2 96%   BMI 26.11 kg/m  Wt Readings from Last 3 Encounters:  06/18/19 182 lb (82.6 kg)  12/14/18 190 lb (86.2 kg)  04/19/18 190 lb 3.2 oz (86.3 kg)     Health Maintenance Due  Topic Date Due  . Hepatitis C Screening  June 14, 1956  . HIV Screening  05/05/1970  . TETANUS/TDAP  05/05/1974  . COLONOSCOPY  05/05/2005    There are no preventive care reminders to display for this patient.  No results found for: TSH Lab Results  Component Value Date   WBC 6.9 04/19/2018   HGB 14.6 04/19/2018   HCT 42.6 04/19/2018   MCV 94 04/19/2018   PLT 270 04/19/2018   Lab Results  Component Value Date   NA 135 12/25/2017   K 4.4 12/25/2017   CO2 21 12/25/2017   GLUCOSE 90 12/25/2017   BUN 10 12/25/2017   CREATININE 0.75 (L) 12/25/2017   BILITOT 1.0 12/25/2017   ALKPHOS 55 12/25/2017   AST 24 12/25/2017   ALT 18 12/25/2017   PROT 6.8 12/25/2017   ALBUMIN 4.6 12/25/2017   CALCIUM 9.4 12/25/2017   Lab Results  Component Value Date   CHOL 136 12/25/2017   Lab Results  Component Value Date   HDL 49 12/25/2017   Lab Results  Component Value Date   LDLCALC 71 12/25/2017   Lab Results  Component Value Date   TRIG 79 12/25/2017   Lab Results  Component Value Date   CHOLHDL 2.8 12/25/2017   No results found for: HGBA1C    Assessment & Plan:   Problem List Items Addressed This Visit      Cardiovascular and Mediastinum   CAD (coronary artery disease)   Relevant Orders   Comprehensive metabolic panel   Hemoglobin A1c   Lipid panel   CBC with Differential     Other    Hyperlipidemia   Relevant Orders   Comprehensive metabolic panel   Lipid panel   CBC with Differential    Other Visit Diagnoses    Diarrhea, unspecified type    -  Primary   Relevant Orders   Comprehensive metabolic panel   Hemoglobin A1c   Lipid panel   TSH   CBC with Differential   CT Abdomen Pelvis W Contrast   Screening for endocrine, metabolic and immunity disorder       Relevant Orders   Comprehensive metabolic panel   Hemoglobin A1c   TSH   CBC with Differential   Encounter for screening for malignant neoplasm of respiratory organs       Relevant Orders   CT CHEST LUNG CA SCREEN LOW DOSE W/O CM   Generalized abdominal pain       Relevant Orders   CT Abdomen Pelvis W Contrast   Special screening for malignant neoplasms, colon       Relevant Orders   Ambulatory referral to Gastroenterology      No orders of the defined types were placed in this encounter.   Follow-up: No follow-ups on file.   PLAN  Labs ordered: CBC, A1c, TSH, CMP, Lipid panel  Imaging ordered: abdominal CT, lung ct, colonoscopy  Do not feel there is peritonitis at this time. Suspect stress, diet change, and lifestyle are largest factors. Suggested psyllium husk to help form bulk in stool. Suggested hydration to replace fluid loss.   Will plan follow up based on lab and imaging results  Patient encouraged to call clinic with any questions, comments, or concerns.   Janeece Ageeichard Sheilyn Boehlke, NP

## 2019-06-18 NOTE — Patient Instructions (Addendum)
  Jose Bright -  Pleasure to meet you today. A brief summary of lab work and orders placed:  CBC: blood counts, will indicate infection or anemia CMP: kidney and liver function, electrolyte levels TSH: thyroid levels A1c: Blood sugar levels over the past 3 months Lipid Panel: Cholesterol   Imaging: Chest CT: recommended for those with 45 pack year history of smoking Abdominal CT: if the blood work turns up inconclusive, this is our next step.  Colonoscopy: Without having had one done, and with diarrhea, this is important.   I expect lab results to be back tomorrow. I expect imaging centers will call you within 1-2 weeks to schedule an appointment.   The supplement I have suggested for you to take is called PSYLLIUM HUSK. It can be found in powder or capsule form. Start with a low dose and increase as you need to help bowel movements form bulk.   We will plan a follow up based on lab results.  If you have lab work done today you will be contacted with your lab results within the next 2 weeks.  If you have not heard from [place] then please contact us. The fastest way to get your results is to register for My Chart.   IF you received an x-ray today, you will receive an invoice from [facility]. Please contact [facility] at [phone] with questions or concerns regarding your invoice.   IF you received labwork today, you will receive an invoice from [place]. Please contact [facility] at [phone] with questions or concerns regarding your invoice.   Our billing staff will not be able to assist you with questions regarding bills from these companies.  You will be contacted with the lab results as soon as they are available. The fastest way to get your results is to activate your My Chart account. Instructions are located on the last page of this paperwork. If you have not heard from [place] regarding the results in 2 weeks, please contact this office.

## 2019-06-19 ENCOUNTER — Encounter: Payer: Self-pay | Admitting: Registered Nurse

## 2019-06-19 LAB — CBC WITH DIFFERENTIAL/PLATELET
Basophils Absolute: 0.1 10*3/uL (ref 0.0–0.2)
Basos: 1 %
EOS (ABSOLUTE): 0.1 10*3/uL (ref 0.0–0.4)
Eos: 1 %
Hematocrit: 41.5 % (ref 37.5–51.0)
Hemoglobin: 14.3 g/dL (ref 13.0–17.7)
Immature Grans (Abs): 0 10*3/uL (ref 0.0–0.1)
Immature Granulocytes: 0 %
Lymphocytes Absolute: 1.5 10*3/uL (ref 0.7–3.1)
Lymphs: 19 %
MCH: 32.4 pg (ref 26.6–33.0)
MCHC: 34.5 g/dL (ref 31.5–35.7)
MCV: 94 fL (ref 79–97)
Monocytes Absolute: 0.6 10*3/uL (ref 0.1–0.9)
Monocytes: 8 %
Neutrophils Absolute: 5.5 10*3/uL (ref 1.4–7.0)
Neutrophils: 71 %
Platelets: 274 10*3/uL (ref 150–450)
RBC: 4.41 x10E6/uL (ref 4.14–5.80)
RDW: 12.4 % (ref 11.6–15.4)
WBC: 7.7 10*3/uL (ref 3.4–10.8)

## 2019-06-19 LAB — COMPREHENSIVE METABOLIC PANEL
ALT: 23 IU/L (ref 0–44)
AST: 19 IU/L (ref 0–40)
Albumin/Globulin Ratio: 2.3 — ABNORMAL HIGH (ref 1.2–2.2)
Albumin: 4.5 g/dL (ref 3.8–4.8)
Alkaline Phosphatase: 48 IU/L (ref 39–117)
BUN/Creatinine Ratio: 12 (ref 10–24)
BUN: 12 mg/dL (ref 8–27)
Bilirubin Total: 1.3 mg/dL — ABNORMAL HIGH (ref 0.0–1.2)
CO2: 19 mmol/L — ABNORMAL LOW (ref 20–29)
Calcium: 9.3 mg/dL (ref 8.6–10.2)
Chloride: 103 mmol/L (ref 96–106)
Creatinine, Ser: 1.03 mg/dL (ref 0.76–1.27)
GFR calc Af Amer: 88 mL/min/{1.73_m2} (ref >59)
GFR calc non Af Amer: 76 mL/min/{1.73_m2} (ref >59)
Globulin, Total: 2 g/dL (ref 1.5–4.5)
Glucose: 96 mg/dL (ref 65–99)
Potassium: 4.5 mmol/L (ref 3.5–5.2)
Sodium: 139 mmol/L (ref 134–144)
Total Protein: 6.5 g/dL (ref 6.0–8.5)

## 2019-06-19 LAB — LIPID PANEL
Chol/HDL Ratio: 2.7 ratio (ref 0.0–5.0)
Cholesterol, Total: 146 mg/dL (ref 100–199)
HDL: 55 mg/dL (ref >39)
LDL Chol Calc (NIH): 67 mg/dL (ref 0–99)
Triglycerides: 141 mg/dL (ref 0–149)
VLDL Cholesterol Cal: 24 mg/dL (ref 5–40)

## 2019-06-19 LAB — HEMOGLOBIN A1C
Est. average glucose Bld gHb Est-mCnc: 103 mg/dL
Hgb A1c MFr Bld: 5.2 % (ref 4.8–5.6)

## 2019-06-19 LAB — TSH: TSH: 1.6 u[IU]/mL (ref 0.450–4.500)

## 2019-06-19 NOTE — Progress Notes (Signed)
Results letter sent to patient Seen for diarrhea/loose stools without melena or hematochezia.  Labs not concerning at this time. Colonoscopy, Abd CT, and Lung CT ordered, will follow up based on results Otherwise suggested psyllium husk and imodium for symptoms  Kathrin Ruddy, NP

## 2019-06-20 ENCOUNTER — Telehealth: Payer: Self-pay | Admitting: Registered Nurse

## 2019-06-20 NOTE — Telephone Encounter (Signed)
Attempting to get ct abdomen authorized. Per [employer] , not enough information and needs further review, they are requesting peer to peer.  Please call [phone]  Pt id number is [id number] Case closes on 06/24/19 at 4pm

## 2019-06-21 NOTE — Telephone Encounter (Signed)
Please advise 

## 2019-06-21 NOTE — Telephone Encounter (Signed)
Called for peer to peer.  Auth: [id number]  Kathrin Ruddy, NP

## 2019-06-23 ENCOUNTER — Other Ambulatory Visit: Payer: Self-pay | Admitting: Cardiovascular Disease

## 2019-06-27 ENCOUNTER — Encounter: Payer: Self-pay | Admitting: Radiology

## 2019-07-02 ENCOUNTER — Other Ambulatory Visit: Payer: Self-pay | Admitting: Registered Nurse

## 2019-07-02 ENCOUNTER — Other Ambulatory Visit: Payer: Self-pay

## 2019-07-02 ENCOUNTER — Ambulatory Visit
Admission: RE | Admit: 2019-07-02 | Discharge: 2019-07-02 | Disposition: A | Discharge status: Home / Self Care | Payer: BLUE CROSS/BLUE SHIELD | Source: Ambulatory Visit | Attending: Registered Nurse | Admitting: Registered Nurse

## 2019-07-02 DIAGNOSIS — R197 Diarrhea, unspecified: Secondary | ICD-10-CM

## 2019-07-02 DIAGNOSIS — Z122 Encounter for screening for malignant neoplasm of respiratory organs: Secondary | ICD-10-CM

## 2019-07-02 DIAGNOSIS — R1084 Generalized abdominal pain: Secondary | ICD-10-CM

## 2019-07-02 DIAGNOSIS — R109 Unspecified abdominal pain: Secondary | ICD-10-CM | POA: Diagnosis not present

## 2019-07-02 DIAGNOSIS — R140 Abdominal distension (gaseous): Secondary | ICD-10-CM | POA: Diagnosis not present

## 2019-07-02 IMAGING — CT CT ABD-PELV W/ CM
1 series · 12 of 32 positions shown, 15 images · IV contrast (APPLIED)
Comparison: None.

CLINICAL DATA: Abdominal discomfort, bloating

EXAM:
CT ABDOMEN AND PELVIS WITH CONTRAST
TECHNIQUE: Multidetector CT imaging of the abdomen and pelvis was performed
using the standard protocol following bolus administration of
intravenous contrast.
CONTRAST:  100mL C99HPC-E22 IOPAMIDOL (C99HPC-E22) INJECTION 61%

[Series 4: sag · sagittal · 0.65mm/px · 12 of 135 slices shown, 15 images]
[im 5/135  lung]
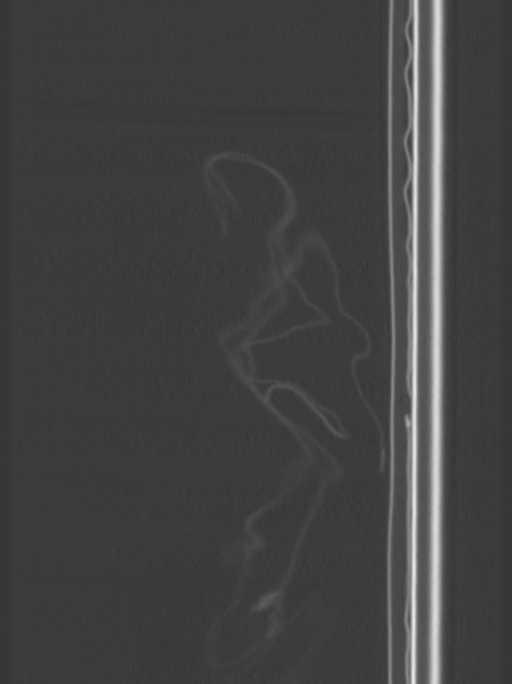
[im 9/135  soft-tissue]
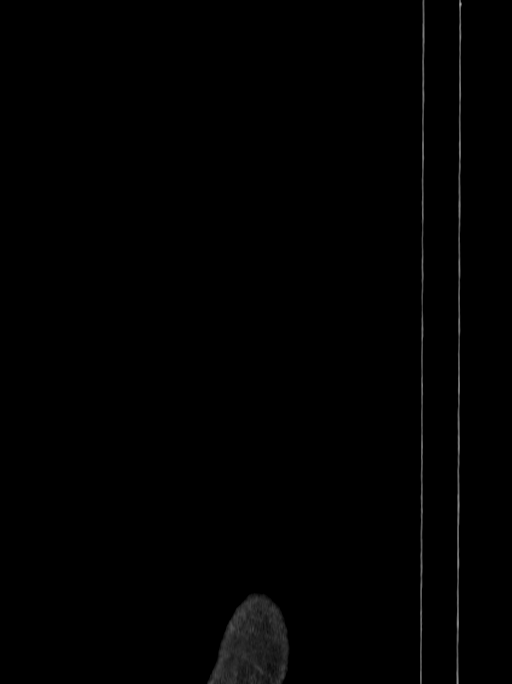
[im 9/135  lung]
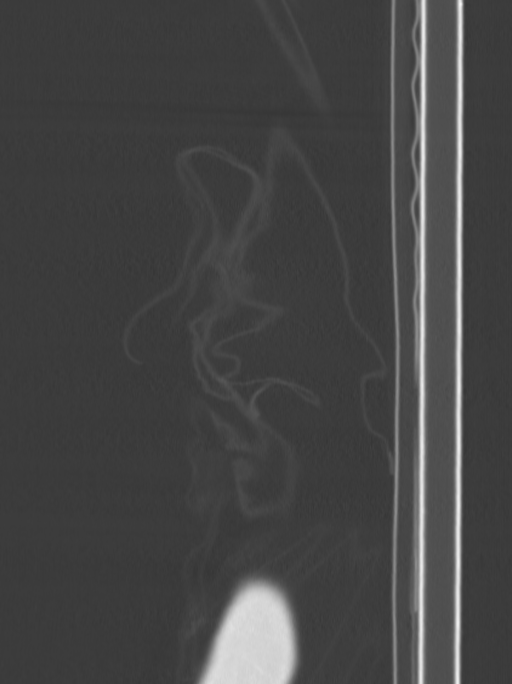
[im 9/135  bone]
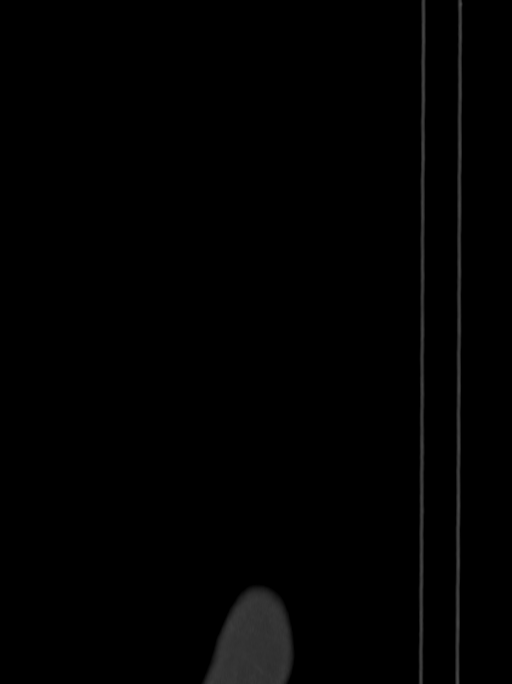
[im 13/135  lung]
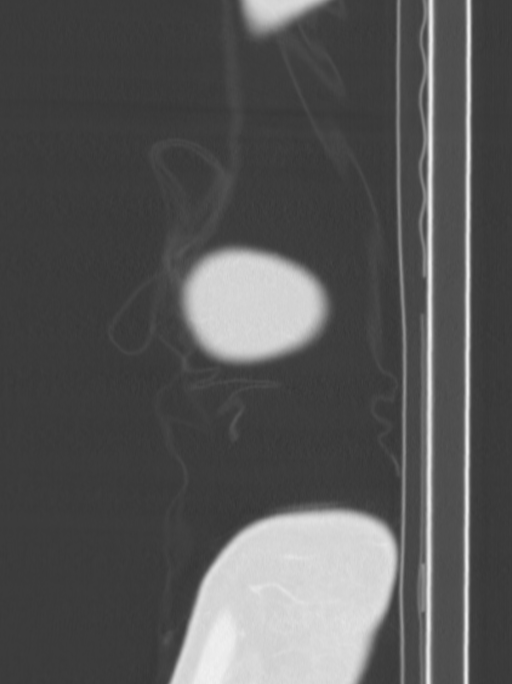
[im 18/135  lung]
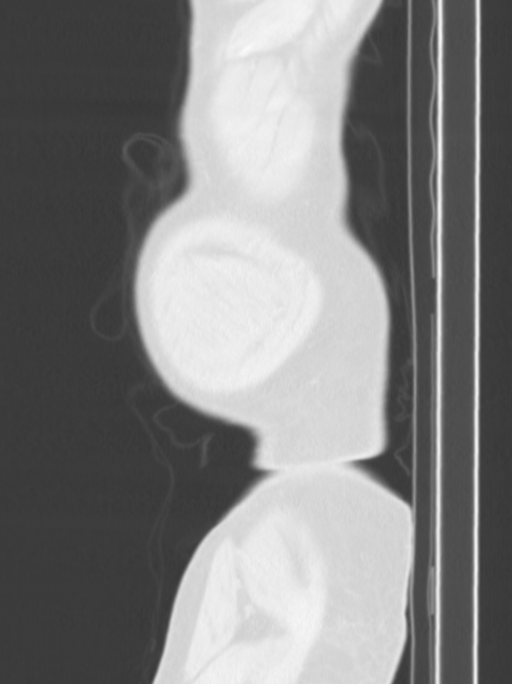
[im 26/135  soft-tissue]
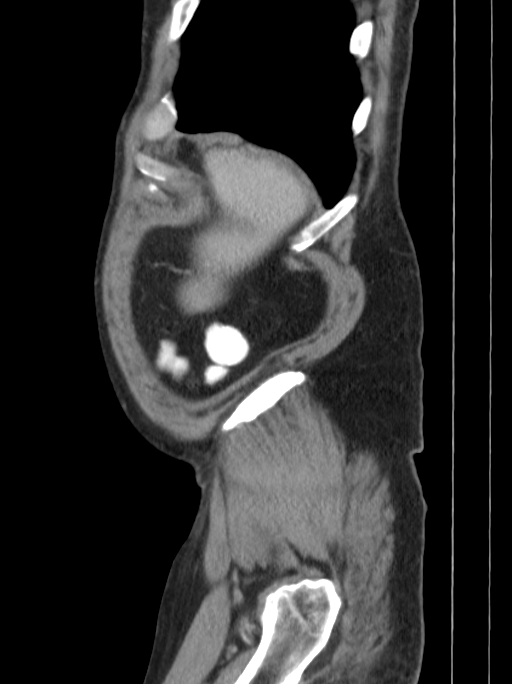
[im 39/135  soft-tissue]
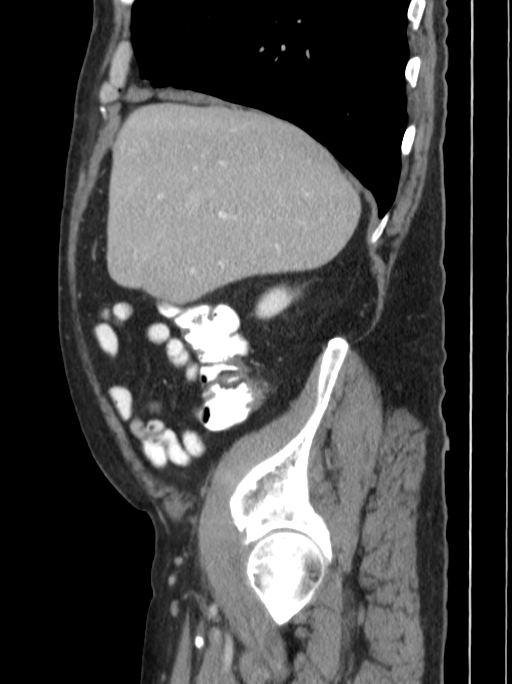
[im 52/135  soft-tissue]
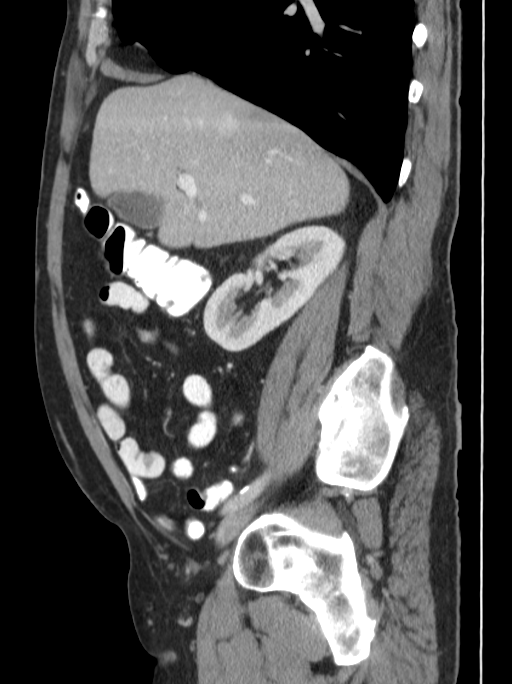
[im 70/135  soft-tissue]
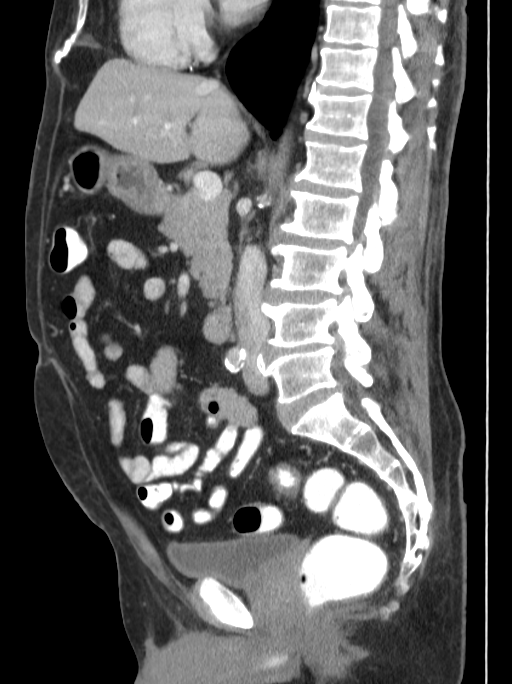
[im 83/135  soft-tissue]
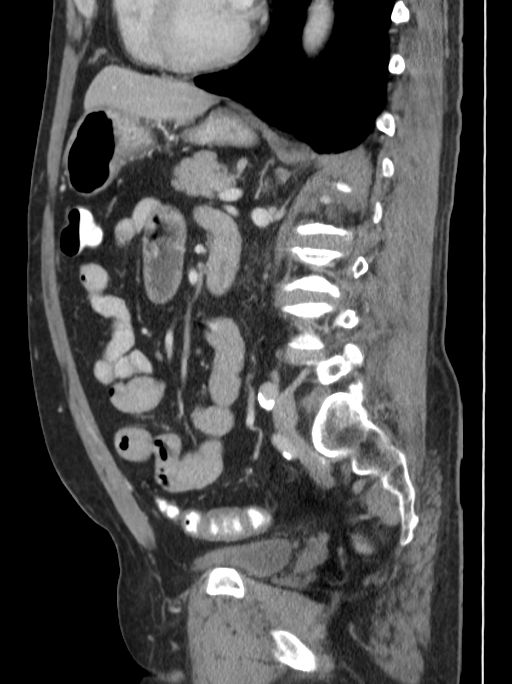
[im 96/135  soft-tissue]
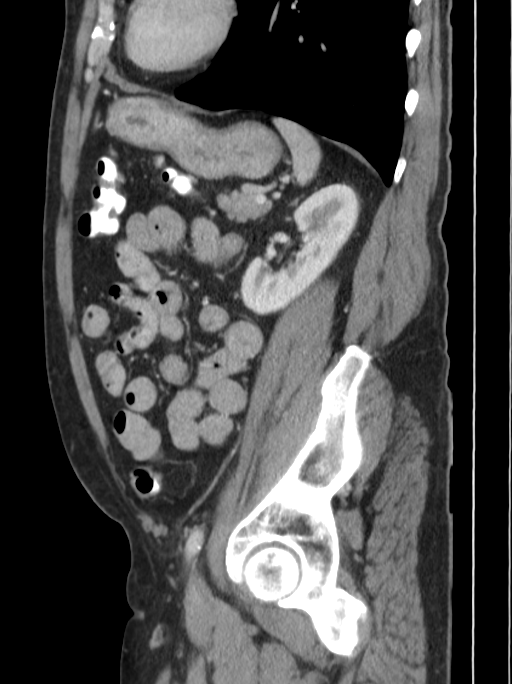
[im 113/135  soft-tissue]
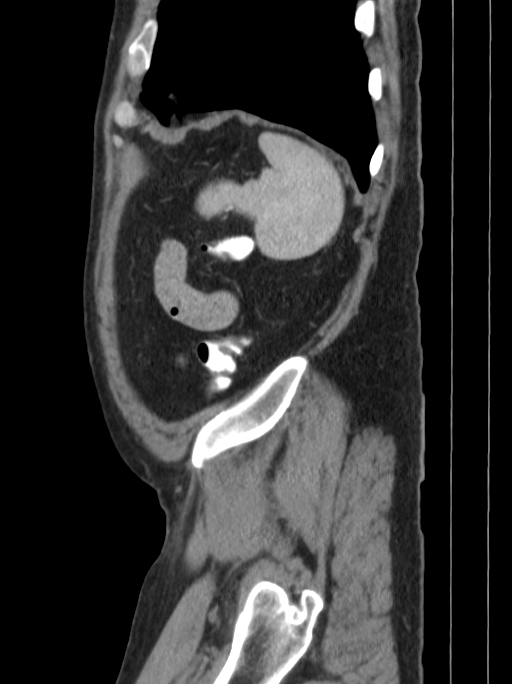
[im 126/135  soft-tissue]
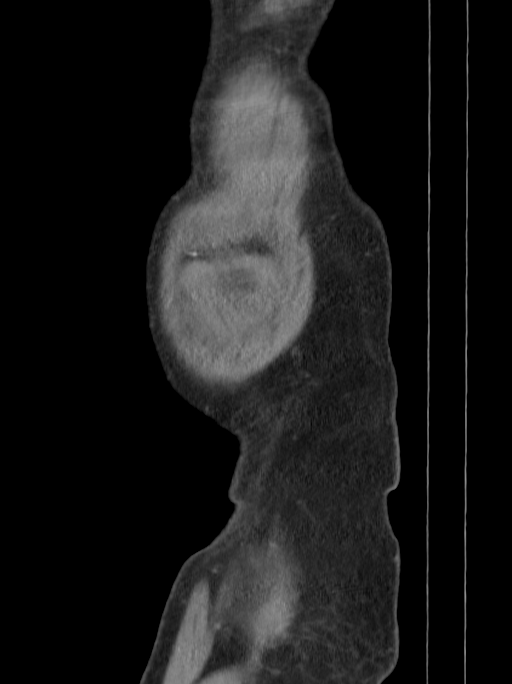
[im 126/135  bone]
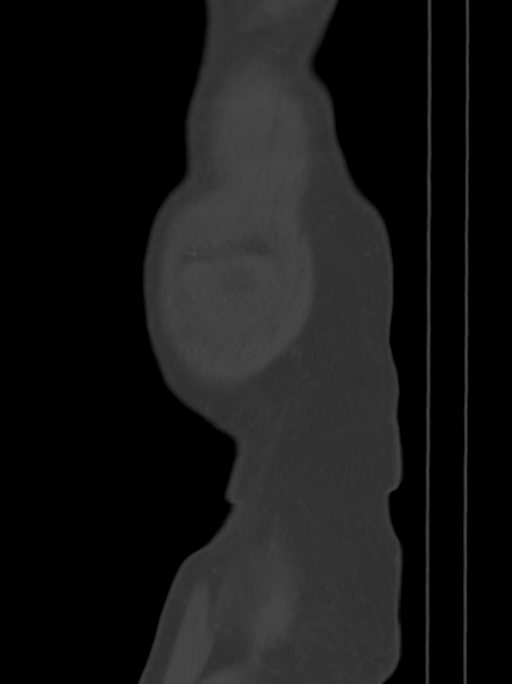

[12 of 32 positions shown; findings below may reference images not displayed]

FINDINGS: Lower chest: 6 mm nodule at the left lung base. Scarring in the
medial right lung base. No effusions. Heart is normal size.

Hepatobiliary: No focal hepatic abnormality. Gallbladder
unremarkable.

Pancreas: No focal abnormality or ductal dilatation.

Spleen: No focal abnormality.  Normal size.

Adrenals/Urinary Tract: No adrenal abnormality. No focal renal
abnormality. No stones or hydronephrosis. Urinary bladder is
unremarkable.

Stomach/Bowel: Stomach, large and small bowel grossly unremarkable.
Normal appendix.

Vascular/Lymphatic: Aortic atherosclerosis. No enlarged abdominal or
pelvic lymph nodes.

Reproductive: No visible focal abnormality.

Other: No free fluid or free air.

Musculoskeletal: No acute bony abnormality.
IMPRESSION: 6 mm left lower lobe pulmonary nodule. Recommend correlation with
today's screening chest CT for possibility of additional nodules and
recommendations.

No acute findings in the abdomen or pelvis.

## 2019-07-02 MED ORDER — IOPAMIDOL (ISOVUE-300) INJECTION 61%
100.0000 mL | Freq: Once | INTRAVENOUS | Status: AC | PRN
  Administered 2019-07-02: 100 mL via INTRAVENOUS

## 2019-07-02 NOTE — Progress Notes (Signed)
Placed order for 32mo follow up CT, per recommendation from radiologist  Kathrin Ruddy, NP

## 2019-07-15 ENCOUNTER — Other Ambulatory Visit: Payer: Self-pay | Admitting: Cardiovascular Disease

## 2019-07-19 ENCOUNTER — Encounter: Payer: Self-pay | Admitting: Registered Nurse

## 2019-08-19 ENCOUNTER — Telehealth: Payer: Self-pay | Admitting: Registered Nurse

## 2019-08-19 NOTE — Telephone Encounter (Signed)
refferral question:  Received for ct nodgl screening (lung screening ) Ordered for December  Asking if patient needs to have this scheduled through the office referral dept

## 2019-09-02 ENCOUNTER — Encounter: Payer: Self-pay | Admitting: Cardiovascular Disease

## 2019-09-02 ENCOUNTER — Other Ambulatory Visit: Payer: Self-pay

## 2019-09-02 ENCOUNTER — Ambulatory Visit (INDEPENDENT_AMBULATORY_CARE_PROVIDER_SITE_OTHER): Payer: BC Managed Care – PPO | Admitting: Cardiovascular Disease

## 2019-09-02 VITALS — BP 140/83 | HR 88 | Temp 97.0°F | Ht 70.0 in | Wt 194.0 lb

## 2019-09-02 DIAGNOSIS — E7800 Pure hypercholesterolemia, unspecified: Secondary | ICD-10-CM

## 2019-09-02 DIAGNOSIS — I2510 Atherosclerotic heart disease of native coronary artery without angina pectoris: Secondary | ICD-10-CM

## 2019-09-02 DIAGNOSIS — F17200 Nicotine dependence, unspecified, uncomplicated: Secondary | ICD-10-CM | POA: Diagnosis not present

## 2019-09-02 DIAGNOSIS — I10 Essential (primary) hypertension: Secondary | ICD-10-CM

## 2019-09-02 MED ORDER — LISINOPRIL 20 MG PO TABS: 20.0000 mg | ORAL_TABLET | Freq: Every day | ORAL | 3 refills | Status: DC

## 2019-09-02 MED ORDER — NITROGLYCERIN 0.4 MG SL SUBL: 0.4000 mg | SUBLINGUAL_TABLET | SUBLINGUAL | 1 refills | Status: DC | PRN

## 2019-09-02 MED ORDER — ATORVASTATIN CALCIUM 40 MG PO TABS: 40.0000 mg | ORAL_TABLET | Freq: Every day | ORAL | 3 refills | Status: DC

## 2019-09-02 NOTE — Patient Instructions (Signed)

## 2019-09-02 NOTE — Progress Notes (Signed)
Cardiology Office Note    Date:  09/02/2019   ID:  Jose Bright, DOB 1956-02-25, MRN [id number]  PCP:  Maximiano Coss, NP  Cardiologist:   Sanda Klein, MD   Chief Complaint  Patient presents with  . Coronary Artery Disease    History of Present Illness:  Jose Bright is a 63 y.o. male these requiring placement of a drug-eluting stent to the mid LAD artery in 2007, ongoing tobacco abuse, hyperlipidemia and hypertension.  The patient specifically denies any chest pain at rest or with exertion, dyspnea at rest or with exertion, orthopnea, paroxysmal nocturnal dyspnea, syncope, palpitations, focal neurological deficits, intermittent claudication, lower extremity edema, unexplained weight gain, cough, hemoptysis or wheezing.  He continues to smoke, although less than a pack a day.  He does not smoke while driving since he does not want to stain the inside of his truck.  He had a normal nuclear stress test in March 2020.  His ECG today shows frequent PACs, but he is not aware of them.  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 Stress tests myocardial perfusion imaging in 2012, 2014 and 2016 showed normal findings, but is 2018 test showed "intermediate risk", due to a calculated EF of 48% and a mild fixed inferior perfusion defect.  In fact, the inferior perfusion defect is not new and has been previously described as diaphragmatic attenuation artifact, which I think it truly is.  He remains free of angina.  On sequential stress tests his activity level has been fairly constant at 9-10 minutes on the Bruce protocol.  In 2020 he underwent a Lexiscan Myoview due to the coronavirus restrictions.  The perfusion images were normal and the ejection fraction was normal at 56%.  He has been out of his lisinopril for a couple of months.  Past Medical History:  Diagnosis Date  . CAD (coronary artery disease)   . Hyperlipidemia   .  Systemic hypertension   . Tobacco abuse     Past Surgical History:  Procedure Laterality Date  . CORONARY ANGIOPLASTY WITH STENT PLACEMENT  05/31/2006   drug eluting stent LAD  . Exercise Treadmill Test  02/04/2013   No significant ischemia  . Nuclear Stress Test  06/07/2011   No ischemia    Current Medications: Outpatient Medications Prior to Visit  Medication Sig Dispense Refill  . aspirin EC 81 MG tablet Take 81 mg by mouth daily. Patient takes randomly not daily    . atorvastatin (LIPITOR) 40 MG tablet Take 1 tablet (40 mg total) by mouth daily. 30 tablet 9  . lisinopril (ZESTRIL) 20 MG tablet TAKE 1 TABLET BY MOUTH EVERY DAY 90 tablet 2  . nitroGLYCERIN (NITROSTAT) 0.4 MG SL tablet Place 0.4 mg under the tongue every 5 (five) minutes as needed for chest pain.     No facility-administered medications prior to visit.      Allergies:   Patient has no known allergies.   Social History   Socioeconomic History  . Marital status: Divorced    Spouse name: Not on file  . Number of children: Not on file  . Years of education: Not on file  . Highest education level: Not on file  Occupational History  . Not on file  Social Needs  . Financial resource strain: Not on file  . Food insecurity    Worry: Not on file    Inability: Not on file  . Transportation needs  Medical: Not on file    Non-medical: Not on file  Tobacco Use  . Smoking status: Current Every Day Smoker    Packs/day: 0.75    Years: 45.00    Pack years: 33.75    Types: Cigarettes  . Smokeless tobacco: Former Engineer, waterUser  Substance and Sexual Activity  . Alcohol use: Yes    Alcohol/week: 7.0 - 14.0 standard drinks    Types: 7 - 14 Standard drinks or equivalent per week  . Drug use: No  . Sexual activity: Not on file  Lifestyle  . Physical activity    Days per week: Not on file    Minutes per session: Not on file  . Stress: Not on file  Relationships  . Social Musicianconnections    Talks on phone: Not on file    Gets  together: Not on file    Attends religious service: Not on file    Active member of club or organization: Not on file    Attends meetings of clubs or organizations: Not on file    Relationship status: Not on file  Other Topics Concern  . Not on file  Social History Narrative  . Not on file     Family History:  The patient's family history includes Heart attack in his father; Heart failure in his father; Hypertension in his father.   ROS:   Please see the history of present illness.    ROS All other systems are reviewed and are negative.   PHYSICAL EXAM:   VS:  BP 140/83   Pulse 88   Temp (!) 97 F (36.1 C)   Ht 5\' 10"  (1.778 m)   Wt 194 lb (88 kg)   SpO2 95%   BMI 27.84 kg/m      General: Alert, oriented x3, no distress, mildly overweight Head: no evidence of trauma, PERRL, EOMI, no exophtalmos or lid lag, no myxedema, no xanthelasma; normal ears, nose and oropharynx Neck: normal jugular venous pulsations and no hepatojugular reflux; brisk carotid pulses without delay and no carotid bruits Chest: clear to auscultation, no signs of consolidation by percussion or palpation, normal fremitus, symmetrical and full respiratory excursions Cardiovascular: normal position and quality of the apical impulse, regular rhythm, normal first and second heart sounds, no murmurs, rubs or gallops Abdomen: no tenderness or distention, no masses by palpation, no abnormal pulsatility or arterial bruits, normal bowel sounds, no hepatosplenomegaly Extremities: no clubbing, cyanosis or edema; 2+ radial, ulnar and brachial pulses bilaterally; 2+ right femoral, posterior tibial and dorsalis pedis pulses; 2+ left femoral, posterior tibial and dorsalis pedis pulses; no subclavian or femoral bruits Neurological: grossly nonfocal Psych: Normal mood and affect   Wt Readings from Last 3 Encounters:  09/02/19 194 lb (88 kg)  07/02/19 180 lb (81.6 kg)  06/18/19 182 lb (82.6 kg)      Studies/Labs  Reviewed:   EKG: ECGs ordered today and shows sinus rhythm with PACs, otherwise normal  Recent Labs: 06/18/2019 Creatinine 1.03, potassium 4.5, normal liver function tests, TSH 1.600 Total cholesterol 146, HDL 55, LDL 69, triglycerides 141 Lipid Panel    Component Value Date/Time   CHOL 146 06/18/2019 1650   TRIG 141 06/18/2019 1650   HDL 55 06/18/2019 1650   CHOLHDL 2.7 06/18/2019 1650   CHOLHDL 2.5 01/06/2017 0930   VLDL 11 01/06/2017 0930   LDLCALC 67 06/18/2019 1650    ASSESSMENT:    1. Essential hypertension      PLAN:  In order of problems  listed above:  1. CAD: Asymptomatic.  The focus remains on risk factor modification. DOT requires treadmill stress test every other year. 2. HLP: All lipid parameters in desirable range.  On statin. 3. HTN: Resume lisinopril.  His blood pressure is not as high as in the past, he has lost some weight.  He can probably do just fine on lisinopril 10 mg once daily.  Target BP less than 130/80 4. Tobacco abuse: As I have done at all our previous meetings, I pointed out that smoking cessation is the single most important thing to avoid future coronary problems.   Medication Adjustments/Labs and Tests Ordered: Current medicines are reviewed at length with the patient today.  Concerns regarding medicines are outlined above.  Medication changes, Labs and Tests ordered today are listed in the Patient Instructions below. Patient Instructions  Medication Instructions:  No changes *If you need a refill on your cardiac medications before your next appointment, please call your pharmacy*  Lab Work: None ordered If you have labs (blood work) drawn today and your tests are completely normal, you will receive your results only by: Marland Kitchen MyChart Message (if you have MyChart) OR . A paper copy in the mail If you have any lab test that is abnormal or we need to change your treatment, we will call you to review the results.  Testing/Procedures: None  ordered  Follow-Up: At [facility], you and your health needs are our priority.  As part of our continuing mission to provide you with exceptional heart care, we have created designated Provider Care Teams.  These Care Teams include your primary Cardiologist (physician) and Advanced Practice Providers (APPs -  Physician Assistants and Nurse Practitioners) who all work together to provide you with the care you need, when you need it.  Your next appointment:   12 month(s)  The format for your next appointment:   In Person  Provider:   Thurmon Fair, MD       Signed, Thurmon Fair, MD  09/02/2019 4:36 PM    [facility] [address] [place], [place], [place]  [address] Phone: [phone]; Fax: [phone]

## 2019-09-13 NOTE — Telephone Encounter (Signed)
LVM for pt to cb to make sure he knows when his appt is with Radiology which is scheduled for 10/01/2019 @10 :20 am .pk/CMA

## 2019-09-25 ENCOUNTER — Telehealth: Payer: Self-pay | Admitting: Registered Nurse

## 2019-09-25 NOTE — Telephone Encounter (Signed)
Please see about getting this authorization by Monday 09/30/19

## 2019-09-25 NOTE — Telephone Encounter (Signed)
Jose Bright from [facility] in regards to an order that needs clarification  Please advise

## 2019-09-30 ENCOUNTER — Telehealth: Payer: Self-pay | Admitting: Registered Nurse

## 2019-09-30 NOTE — Telephone Encounter (Signed)
LVM on the Direct line # that was provided for them to contact the office so that we can take care of this for pt by 3pm

## 2019-09-30 NOTE — Telephone Encounter (Signed)
Pt is scheduled ct chest low dose f/u  auth on file is for ct abdomin pelvis   Need clarification and authorization by 3:00 today or the appt will be cancelled until it can be authorized    Please advise   Direct line  [phone]

## 2019-09-30 NOTE — Telephone Encounter (Signed)
Per insurance, ct will not be approved due to the diagnosis.  This is a specific follow up procedure,and the screening diagnosis will not be approved for this exam.  The diagnosis needs to say something about the patient having nodules.  Thanks

## 2019-09-30 NOTE — Telephone Encounter (Signed)
Please see notes from message. Spoke with Santiago Glad from [facility] and she stated that there is no authorization for the Ct low dose F/U and she needs that to be place to make sure the pt is ready for tomorrow.

## 2019-10-01 NOTE — Telephone Encounter (Signed)
Pls advise.  

## 2019-10-11 ENCOUNTER — Telehealth (INDEPENDENT_AMBULATORY_CARE_PROVIDER_SITE_OTHER): Payer: BC Managed Care – PPO | Admitting: Registered Nurse

## 2019-10-11 ENCOUNTER — Other Ambulatory Visit: Payer: Self-pay

## 2019-10-11 DIAGNOSIS — R911 Solitary pulmonary nodule: Secondary | ICD-10-CM

## 2019-10-11 DIAGNOSIS — IMO0001 Reserved for inherently not codable concepts without codable children: Secondary | ICD-10-CM

## 2019-10-11 NOTE — Progress Notes (Signed)
Telemedicine Encounter- SOAP NOTE Established Patient  This telephone encounter was conducted with the patient's (or proxy's) verbal consent via audio telecommunications: yes  Patient was instructed to have this encounter in a suitably private space; and to only have persons present to whom they give permission to participate. In addition, patient identity was confirmed by use of name plus two identifiers (DOB and address).  I discussed the limitations, risks, security and privacy concerns of performing an evaluation and management service by telephone and the availability of in person appointments. I also discussed with the patient that there may be a patient responsible charge related to this service. The patient expressed understanding and agreed to proceed.  I spent a total of 16 minutes talking with the patient or their proxy.  No chief complaint on file.   Subjective   Jose Bright is a 64 y.o. established patient. Telephone visit today for discussing past imaging and coordinating future studies  HPI Pt disappointed to find out that his follow up CT canceled by [facility] as insurance would not cover it. He was curious as to why this had happened We discussed his past imaging - 3 nodules of avg 73mm diameter seen on CT - that warranted follow up in 3 mos per radiology We discussed potential issues that may have occurred and will replace the order  Pt overall feeling well. Baseline "smoker's cough" but otherwise no alarming symptoms.   Patient Active Problem List   Diagnosis Date Noted  . CAD (coronary artery disease) 02/28/2013  . Tobacco abuse   . Hyperlipidemia   . Systemic hypertension     Past Medical History:  Diagnosis Date  . CAD (coronary artery disease)   . Hyperlipidemia   . Systemic hypertension   . Tobacco abuse     Current Outpatient Medications  Medication Sig Dispense Refill  . atorvastatin (LIPITOR) 40 MG tablet Take 1 tablet (40 mg total) by  mouth daily. 90 tablet 3  . lisinopril (ZESTRIL) 20 MG tablet Take 1 tablet (20 mg total) by mouth daily. 90 tablet 3  . nitroGLYCERIN (NITROSTAT) 0.4 MG SL tablet Place 1 tablet (0.4 mg total) under the tongue every 5 (five) minutes as needed for chest pain. 25 tablet 1  . aspirin EC 81 MG tablet Take 81 mg by mouth daily. Patient takes randomly not daily     No current facility-administered medications for this visit.    No Known Allergies  Social History   Socioeconomic History  . Marital status: Divorced    Spouse name: Not on file  . Number of children: Not on file  . Years of education: Not on file  . Highest education level: Not on file  Occupational History  . Not on file  Tobacco Use  . Smoking status: Current Every Day Smoker    Packs/day: 0.75    Years: 45.00    Pack years: 33.75    Types: Cigarettes  . Smokeless tobacco: Former Network engineer and Sexual Activity  . Alcohol use: Yes    Alcohol/week: 7.0 - 14.0 standard drinks    Types: 7 - 14 Standard drinks or equivalent per week  . Drug use: No  . Sexual activity: Not on file  Other Topics Concern  . Not on file  Social History Narrative  . Not on file   Social Determinants of Health   Financial Resource Strain:   . Difficulty of Paying Living Expenses: Not on file  Food Insecurity:   .  Worried About Programme researcher, broadcasting/film/video in the Last Year: Not on file  . Ran Out of Food in the Last Year: Not on file  Transportation Needs:   . Lack of Transportation (Medical): Not on file  . Lack of Transportation (Non-Medical): Not on file  Physical Activity:   . Days of Exercise per Week: Not on file  . Minutes of Exercise per Session: Not on file  Stress:   . Feeling of Stress : Not on file  Social Connections:   . Frequency of Communication with Friends and Family: Not on file  . Frequency of Social Gatherings with Friends and Family: Not on file  . Attends Religious Services: Not on file  . Active Member of  Clubs or Organizations: Not on file  . Attends Banker Meetings: Not on file  . Marital Status: Not on file  Intimate Partner Violence:   . Fear of Current or Ex-Partner: Not on file  . Emotionally Abused: Not on file  . Physically Abused: Not on file  . Sexually Abused: Not on file    Review of Systems  Constitutional: Negative.   HENT: Negative.   Eyes: Negative.   Respiratory: Negative.   Cardiovascular: Negative.   Gastrointestinal: Negative.   Genitourinary: Negative.   Musculoskeletal: Negative.   Skin: Negative.   Neurological: Negative.   Psychiatric/Behavioral: Negative.   All other systems reviewed and are negative.   Objective   Vitals as reported by the patient: There were no vitals filed for this visit.  Diagnoses and all orders for this visit:  Lung nodule < 6cm on CT -     CT CHEST NODULE FOLLOW UP LOW DOSE W/O; Future   PLAN  I have replaced the follow up CT order directly as indicated from past radiology report. Will follow up with [facility] and pt insurance if needed for any further information  Patient encouraged to call clinic with any questions, comments, or concerns.   I discussed the assessment and treatment plan with the patient. The patient was provided an opportunity to ask questions and all were answered. The patient agreed with the plan and demonstrated an understanding of the instructions.   The patient was advised to call back or seek an in-person evaluation if the symptoms worsen or if the condition fails to improve as anticipated.  I provided 16 minutes of non-face-to-face time during this encounter.  Janeece Agee, NP  Primary Care at [facility]

## 2019-10-11 NOTE — Progress Notes (Signed)
Patient patient has concerns about insurance/cancellation. patient need clearance from primary care to receive a scan at [facility] imaging since they have cancelled his appointment at the last minute.

## 2019-11-01 ENCOUNTER — Ambulatory Visit
Admission: RE | Admit: 2019-11-01 | Discharge: 2019-11-01 | Disposition: A | Discharge status: Home / Self Care | Payer: BC Managed Care – PPO | Source: Ambulatory Visit | Attending: Registered Nurse | Admitting: Registered Nurse

## 2019-11-01 DIAGNOSIS — IMO0001 Reserved for inherently not codable concepts without codable children: Secondary | ICD-10-CM

## 2019-11-01 DIAGNOSIS — J439 Emphysema, unspecified: Secondary | ICD-10-CM | POA: Diagnosis not present

## 2019-11-01 DIAGNOSIS — R911 Solitary pulmonary nodule: Secondary | ICD-10-CM

## 2019-11-08 ENCOUNTER — Telehealth: Payer: Self-pay | Admitting: Registered Nurse

## 2019-11-08 NOTE — Telephone Encounter (Signed)
Pt would like a cb concerning a CT he had done on 11/01/19. Please advise at [phone].

## 2019-11-08 NOTE — Telephone Encounter (Signed)
Mei -  If you wouldn't mind giving him a call, the results say that the nodules in his lungs are benign (not cancerous) in appearing and that the recommendation is to follow up with another scan in 1 year, that would be great.  Otherwise noted was some atherosclerosis and emphysema, which we had already been aware of and are not urgent concerns at this time.  Thank you  Jari Sportsman, NP

## 2019-11-08 NOTE — Telephone Encounter (Signed)
Please advise on the CT results from 11/01/19

## 2019-11-08 NOTE — Telephone Encounter (Signed)
I have called pt back and relayed the message/ results of the scan. Pt stated understanding and was very happy that his lung nodules are benign.

## 2020-04-22 DIAGNOSIS — H02834 Dermatochalasis of left upper eyelid: Secondary | ICD-10-CM | POA: Diagnosis not present

## 2020-04-22 DIAGNOSIS — H2513 Age-related nuclear cataract, bilateral: Secondary | ICD-10-CM | POA: Diagnosis not present

## 2020-04-22 DIAGNOSIS — H35033 Hypertensive retinopathy, bilateral: Secondary | ICD-10-CM | POA: Diagnosis not present

## 2020-04-22 DIAGNOSIS — H04123 Dry eye syndrome of bilateral lacrimal glands: Secondary | ICD-10-CM | POA: Diagnosis not present

## 2020-04-22 DIAGNOSIS — H02831 Dermatochalasis of right upper eyelid: Secondary | ICD-10-CM | POA: Diagnosis not present

## 2020-05-29 IMAGING — CT CT CHEST LUNG CANCER SCREENING LOW DOSE W/O CM
1 series · 14 of 31 positions shown, 18 images · non-contrast
Comparison: No priors.

CLINICAL DATA: 63-year-old male current smoker with 45 pack-year
history of smoking. Lung cancer screening examination.

EXAM:
CT CHEST WITHOUT CONTRAST LOW-DOSE FOR LUNG CANCER SCREENING
TECHNIQUE: Multidetector CT imaging of the chest was performed following the
standard protocol without IV contrast.

[Series 2: ldct screening <30 bmi · axial · 0.75mm/px · z∈[-361,-36]mm · 14 of 77 slices shown, 18 images]
[im 6/77  mediastinal]
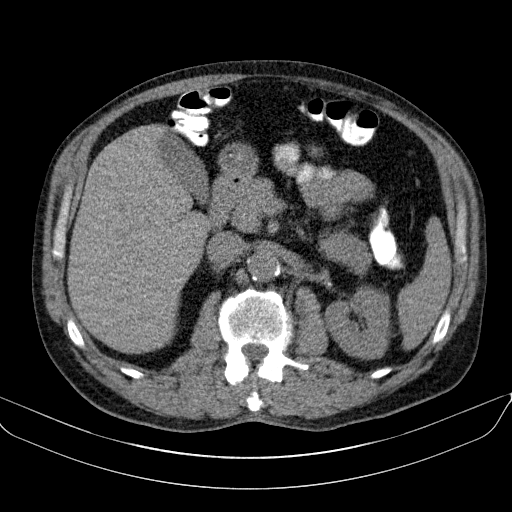
[im 6/77  lung]
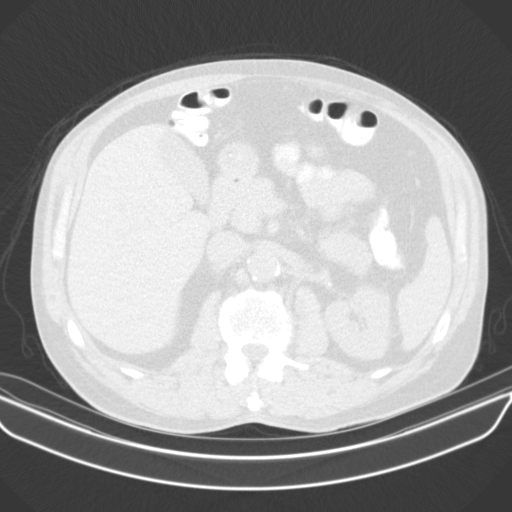
[im 12/77  lung]
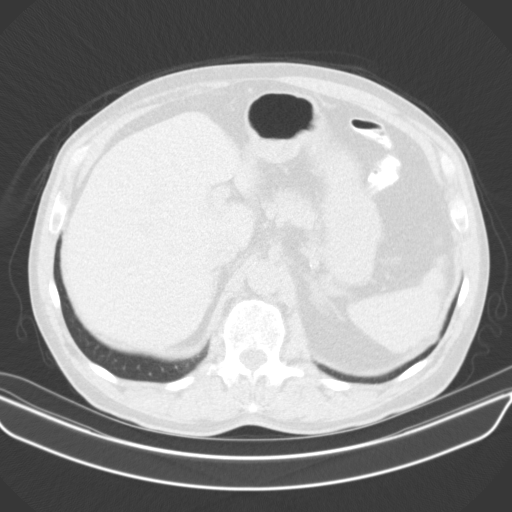
[im 16/77  lung]
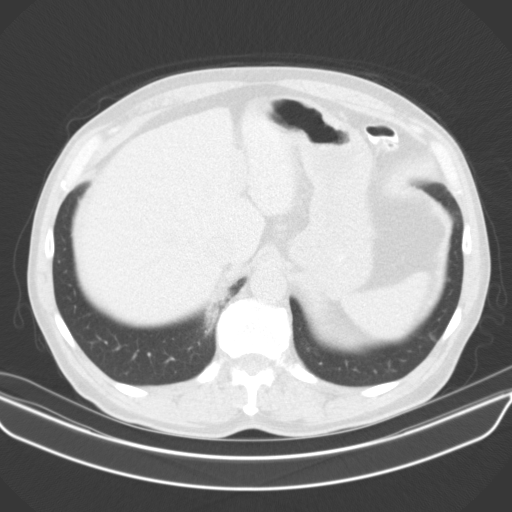
[im 20/77  lung]
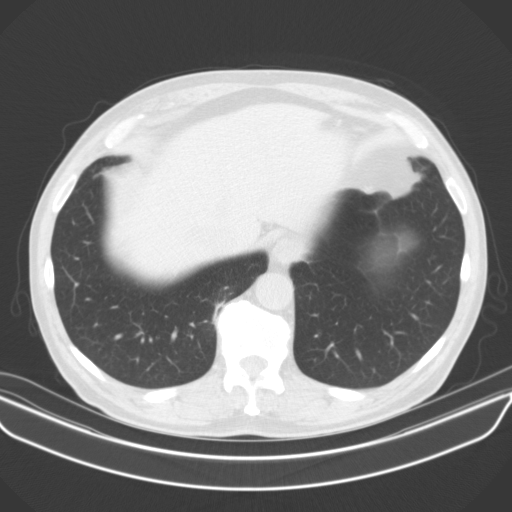
[im 26/77  mediastinal]
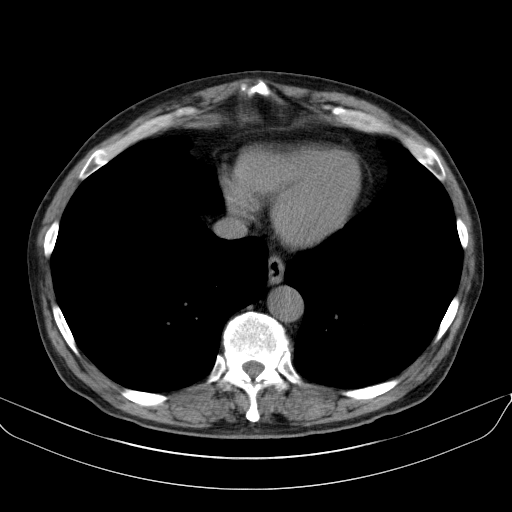
[im 26/77  lung]
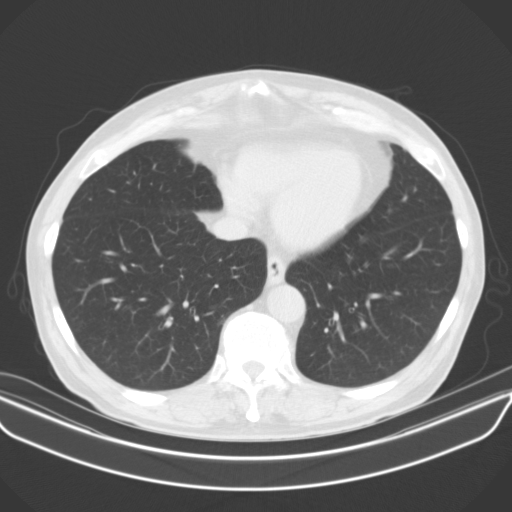
[im 31/77  lung]
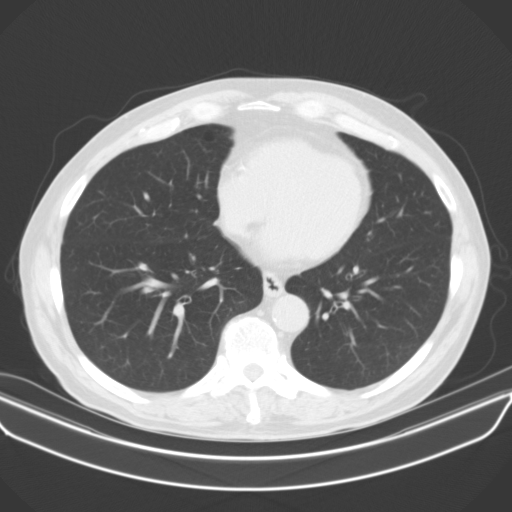
[im 37/77  lung]
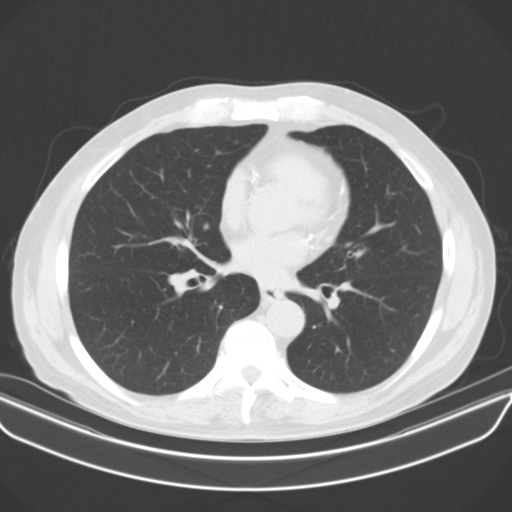
[im 41/77  lung]
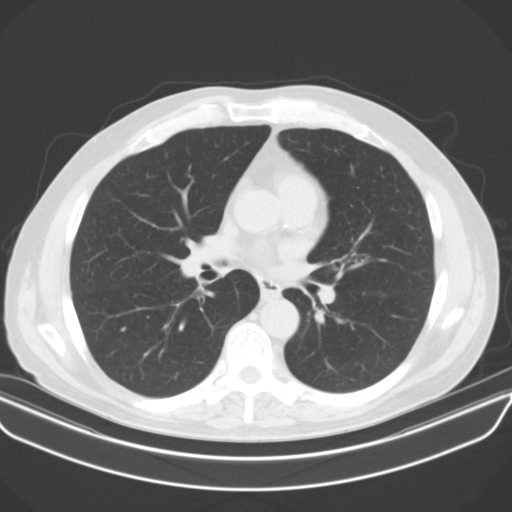
[im 46/77  mediastinal]
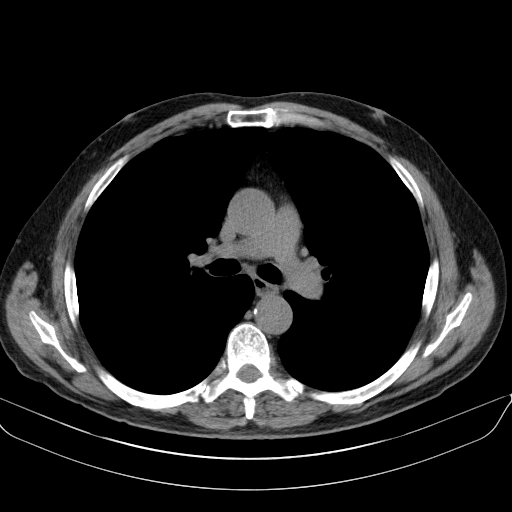
[im 46/77  lung]
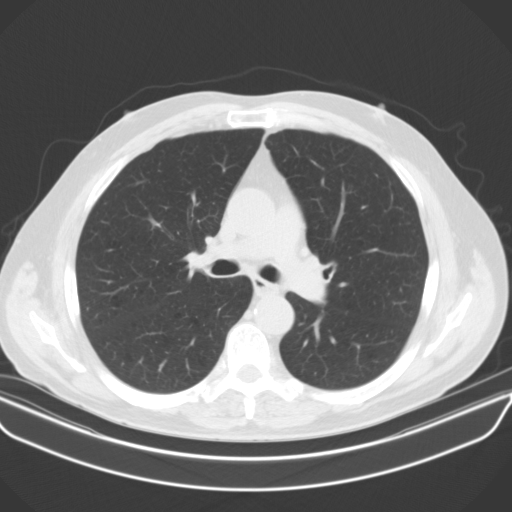
[im 51/77  lung]
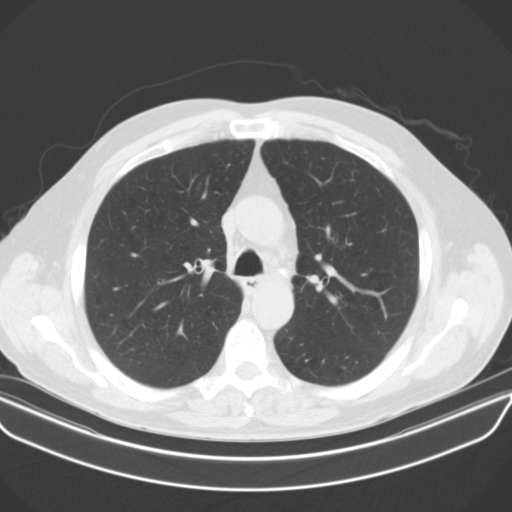
[im 57/77  lung]
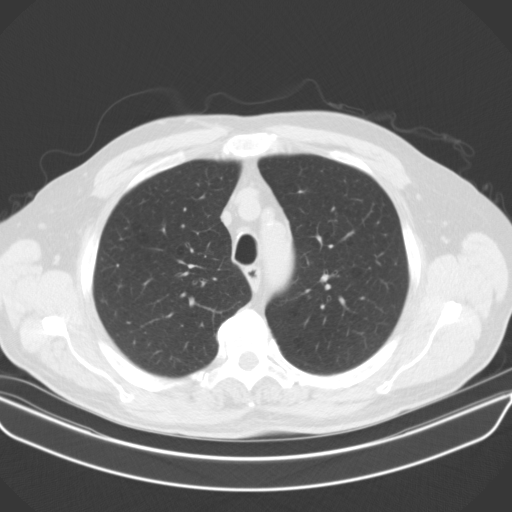
[im 61/77  lung]
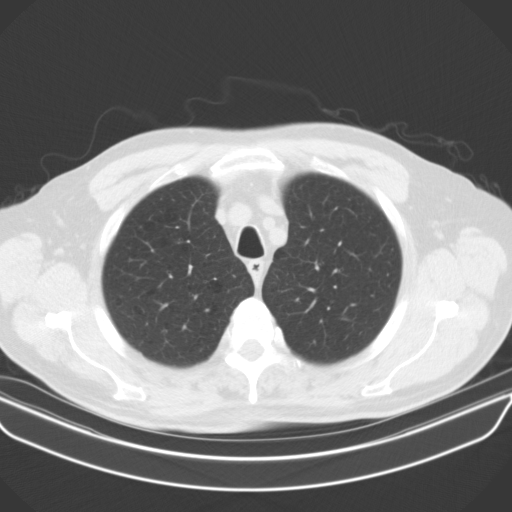
[im 65/77  mediastinal]
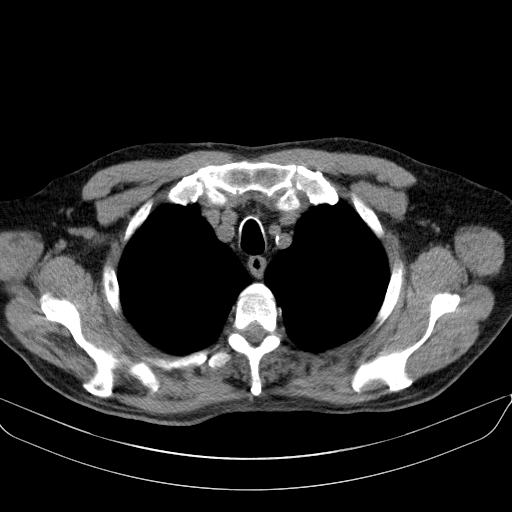
[im 65/77  lung]
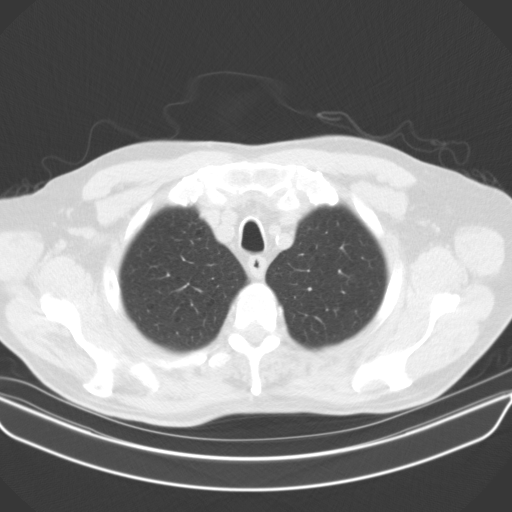
[im 71/77  lung]
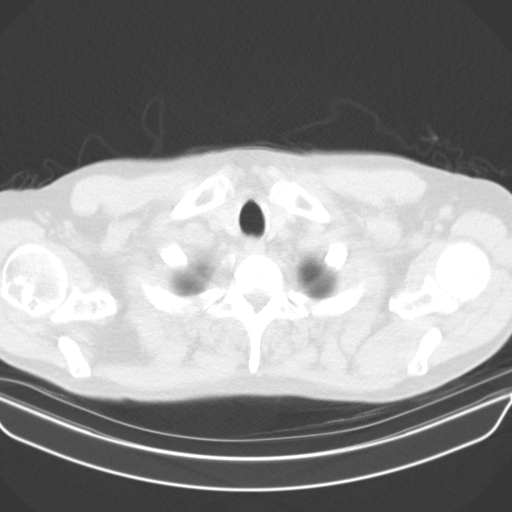

[14 of 31 positions shown; findings below may reference images not displayed]

FINDINGS: Cardiovascular: Heart size is normal. There is no significant
pericardial fluid, thickening or pericardial calcification. There is
aortic atherosclerosis, as well as atherosclerosis of the great
vessels of the mediastinum and the coronary arteries, including
calcified atherosclerotic plaque in the left main, left anterior
descending, left circumflex and right coronary arteries.

Mediastinum/Nodes: No pathologically enlarged mediastinal or hilar
lymph nodes. Please note that accurate exclusion of hilar adenopathy
is limited on noncontrast CT scans. Esophagus is unremarkable in
appearance. No axillary lymphadenopathy.

Lungs/Pleura: Multiple small pulmonary nodules are noted throughout
the lungs bilaterally, largest of which is in the periphery of the
left lower lobe (axial image 313 of series 3), with a volume derived
mean diameter of 8.0 mm. No larger more suspicious appearing
pulmonary nodules or masses are noted. No acute consolidative
airspace disease. No pleural effusions. Mild diffuse bronchial wall
thickening with mild centrilobular and paraseptal emphysema.

Upper Abdomen: Aortic atherosclerosis.

Musculoskeletal: There are no aggressive appearing lytic or blastic
lesions noted in the visualized portions of the skeleton.
IMPRESSION: 1. Lung-RADS 4AS, suspicious. Follow up low-dose chest CT without
contrast in 3 months (please use the following order, "CT CHEST LCS
NODULE FOLLOW-UP W/O CM") is recommended. Alternatively, PET may be
considered when there is a solid component 8mm or larger.
2. The "S" modifier above refers to potentially clinically
significant non lung cancer related findings. Specifically, there is
aortic atherosclerosis, in addition to left main and 3 vessel
coronary artery disease. Please note that although the presence of
coronary artery calcium documents the presence of coronary artery
disease, the severity of this disease and any potential stenosis
cannot be assessed on this non-gated CT examination. Assessment for
potential risk factor modification, dietary therapy or pharmacologic
therapy may be warranted, if clinically indicated.
3. Mild diffuse bronchial wall thickening with mild centrilobular
and paraseptal emphysema; imaging findings suggestive of underlying
COPD.

These results will be called to the ordering clinician or
representative by the Radiologist Assistant, and communication
documented in the PACS or zVision Dashboard.

Aortic Atherosclerosis (W9339-2HT.T) and Emphysema (W9339-I16.Z).

## 2020-09-30 ENCOUNTER — Telehealth: Payer: Self-pay | Admitting: Cardiovascular Disease

## 2020-09-30 DIAGNOSIS — I2510 Atherosclerotic heart disease of native coronary artery without angina pectoris: Secondary | ICD-10-CM

## 2020-09-30 NOTE — Telephone Encounter (Signed)
Would you like to go ahead and order for the test so we can get is scheduled- and an appointment with you? Thank you!

## 2020-09-30 NOTE — Telephone Encounter (Signed)
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 He states he is required to have the stress test prior to March, 2022 and when he receives a call back he will also schedule his follow up appointment with Dr. Royann Shivers. Please assist.

## 2020-09-30 NOTE — Telephone Encounter (Signed)
Orders placed for a Lexiscan Myoview. Message sent to scheduling.

## 2020-10-31 ENCOUNTER — Other Ambulatory Visit: Payer: Self-pay | Admitting: Cardiovascular Disease

## 2020-11-01 ENCOUNTER — Other Ambulatory Visit: Payer: Self-pay | Admitting: Cardiovascular Disease

## 2020-11-27 ENCOUNTER — Telehealth (HOSPITAL_COMMUNITY): Payer: Self-pay | Admitting: *Deleted

## 2020-11-27 NOTE — Telephone Encounter (Signed)
Close encounter 

## 2020-12-01 ENCOUNTER — Ambulatory Visit (HOSPITAL_COMMUNITY)
Admission: RE | Admit: 2020-12-01 | Discharge: 2020-12-01 | Disposition: A | Discharge status: Home / Self Care | Payer: Medicare Other | Source: Ambulatory Visit | Attending: Cardiology | Admitting: Cardiology

## 2020-12-01 ENCOUNTER — Other Ambulatory Visit: Payer: Self-pay

## 2020-12-01 DIAGNOSIS — I2510 Atherosclerotic heart disease of native coronary artery without angina pectoris: Secondary | ICD-10-CM | POA: Diagnosis not present

## 2020-12-01 LAB — MYOCARDIAL PERFUSION IMAGING
LV dias vol: 122 mL (ref 62–150)
LV sys vol: 54 mL
Peak HR: 97 {beats}/min
Rest HR: 55 {beats}/min
SDS: 0
SRS: 4
SSS: 4
TID: 1.08

## 2020-12-01 MED ORDER — TECHNETIUM TC 99M TETROFOSMIN IV KIT
32.0000 | PACK | Freq: Once | INTRAVENOUS | Status: AC | PRN
  Administered 2020-12-01: 32 via INTRAVENOUS
  Filled 2020-12-01: qty 32

## 2020-12-01 MED ORDER — REGADENOSON 0.4 MG/5ML IV SOLN
0.4000 mg | Freq: Once | INTRAVENOUS | Status: AC
  Administered 2020-12-01: 0.4 mg via INTRAVENOUS

## 2020-12-01 MED ORDER — TECHNETIUM TC 99M TETROFOSMIN IV KIT
10.3000 | PACK | Freq: Once | INTRAVENOUS | Status: AC | PRN
  Administered 2020-12-01: 10.3 via INTRAVENOUS
  Filled 2020-12-01: qty 11

## 2020-12-01 MED ORDER — AMINOPHYLLINE 25 MG/ML IV SOLN
75.0000 mg | Freq: Once | INTRAVENOUS | Status: AC
  Administered 2020-12-01: 75 mg via INTRAVENOUS

## 2020-12-04 ENCOUNTER — Encounter: Payer: Self-pay | Admitting: *Deleted

## 2020-12-30 ENCOUNTER — Ambulatory Visit (INDEPENDENT_AMBULATORY_CARE_PROVIDER_SITE_OTHER): Payer: Medicare Other | Admitting: Cardiovascular Disease

## 2020-12-30 ENCOUNTER — Other Ambulatory Visit: Payer: Self-pay

## 2020-12-30 VITALS — BP 130/80 | HR 70 | Ht 70.0 in | Wt 185.2 lb

## 2020-12-30 DIAGNOSIS — I2510 Atherosclerotic heart disease of native coronary artery without angina pectoris: Secondary | ICD-10-CM

## 2020-12-30 DIAGNOSIS — I10 Essential (primary) hypertension: Secondary | ICD-10-CM

## 2020-12-30 DIAGNOSIS — Z720 Tobacco use: Secondary | ICD-10-CM | POA: Diagnosis not present

## 2020-12-30 DIAGNOSIS — E7800 Pure hypercholesterolemia, unspecified: Secondary | ICD-10-CM | POA: Diagnosis not present

## 2020-12-30 DIAGNOSIS — J410 Simple chronic bronchitis: Secondary | ICD-10-CM

## 2020-12-30 LAB — BASIC METABOLIC PANEL
BUN/Creatinine Ratio: 7 — ABNORMAL LOW (ref 10–24)
BUN: 7 mg/dL — ABNORMAL LOW (ref 8–27)
CO2: 23 mmol/L (ref 20–29)
Calcium: 9.1 mg/dL (ref 8.6–10.2)
Chloride: 99 mmol/L (ref 96–106)
Creatinine, Ser: 0.94 mg/dL (ref 0.76–1.27)
Glucose: 100 mg/dL — ABNORMAL HIGH (ref 65–99)
Potassium: 4.6 mmol/L (ref 3.5–5.2)
Sodium: 139 mmol/L (ref 134–144)
eGFR: 90 mL/min/{1.73_m2} (ref >59)

## 2020-12-30 LAB — LIPID PANEL
Chol/HDL Ratio: 2.6 ratio (ref 0.0–5.0)
Cholesterol, Total: 146 mg/dL (ref 100–199)
HDL: 56 mg/dL (ref >39)
LDL Chol Calc (NIH): 71 mg/dL (ref 0–99)
Triglycerides: 101 mg/dL (ref 0–149)
VLDL Cholesterol Cal: 19 mg/dL (ref 5–40)

## 2020-12-30 MED ORDER — SPIRIVA HANDIHALER 18 MCG IN CAPS: 18.0000 ug | ORAL_CAPSULE | Freq: Every day | RESPIRATORY_TRACT | 1 refills | Status: DC

## 2020-12-30 NOTE — Patient Instructions (Addendum)
Medication Instructions:  USE the SPIRIVA inhaler once daily  *If you need a refill on your cardiac medications before your next appointment, please call your pharmacy*   Lab Work: Your provider would like for you to have the following labs today: lipid and BMET  If you have labs (blood work) drawn today and your tests are completely normal, you will receive your results only by: Marland Kitchen MyChart Message (if you have MyChart) OR . A paper copy in the mail If you have any lab test that is abnormal or we need to change your treatment, we will call you to review the results.   Testing/Procedures: None ordered   Follow-Up: At [facility], you and your health needs are our priority.  As part of our continuing mission to provide you with exceptional heart care, we have created designated Provider Care Teams.  These Care Teams include your primary Cardiologist (physician) and Advanced Practice Providers (APPs -  Physician Assistants and Nurse Practitioners) who all work together to provide you with the care you need, when you need it.  We recommend signing up for the patient portal called "MyChart".  Sign up information is provided on this After Visit Summary.  MyChart is used to connect with patients for Virtual Visits (Telemedicine).  Patients are able to view lab/test results, encounter notes, upcoming appointments, etc.  Non-urgent messages can be sent to your provider as well.   To learn more about what you can do with MyChart, go to [email or web address].    Your next appointment:   12 month(s)  The format for your next appointment:   In Person  Provider:   You may see Thurmon Fair, MD or one of the following Advanced Practice Providers on your designated Care Team:    Azalee Course, PA-C  Micah Flesher, [place] or   Judy Pimple, PA-C   Steps to Quit Smoking Smoking tobacco is the leading cause of preventable death. It can affect almost every organ in the body. Smoking puts you and  those around you at risk for developing many serious chronic diseases. Quitting smoking can be difficult, but it is one of the best things that you can do for your health. It is never too late to quit. How do I get ready to quit? When you decide to quit smoking, create a plan to help you succeed. Before you quit:  Pick a date to quit. Set a date within the next 2 weeks to give you time to prepare.  Write down the reasons why you are quitting. Keep this list in places where you will see it often.  Tell your family, friends, and co-workers that you are quitting. Support from your loved ones can make quitting easier.  Talk with your health care provider about your options for quitting smoking.  Find out what treatment options are covered by your health insurance.  Identify people, places, things, and activities that make you want to smoke (triggers). Avoid them. What first steps can I take to quit smoking?  Throw away all cigarettes at home, at work, and in your car.  Throw away smoking accessories, such as Set designer.  Clean your car. Make sure to empty the ashtray.  Clean your home, including curtains and carpets. What strategies can I use to quit smoking? Talk with your health care provider about combining strategies, such as taking medicines while you are also receiving in-person counseling. Using these two strategies together makes you more likely to succeed in quitting than  if you used either strategy on its own.  If you are pregnant or breastfeeding, talk with your health care provider about finding counseling or other support strategies to quit smoking. Do not take medicine to help you quit smoking unless your health care provider tells you to do so. To quit smoking: Quit right away  Quit smoking completely, instead of gradually reducing how much you smoke over a period of time. Research shows that stopping smoking right away is more successful than gradually  quitting.  Attend in-person counseling to help you build problem-solving skills. You are more likely to succeed in quitting if you attend counseling sessions regularly. Even short sessions of 10 minutes can be effective. Take medicine You may take medicines to help you quit smoking. Some medicines require a prescription and some you can purchase over-the-counter. Medicines may have nicotine in them to replace the nicotine in cigarettes. Medicines may:  Help to stop cravings.  Help to relieve withdrawal symptoms. Your health care provider may recommend:  Nicotine patches, gum, or lozenges.  Nicotine inhalers or sprays.  Non-nicotine medicine that is taken by mouth. Find resources Find resources and support systems that can help you to quit smoking and remain smoke-free after you quit. These resources are most helpful when you use them often. They include:  Online chats with a Veterinary surgeon.  Telephone quitlines.  Printed Materials engineer.  Support groups or group counseling.  Text messaging programs.  Mobile phone apps or applications. Use apps that can help you stick to your quit plan by providing reminders, tips, and encouragement. There are many free apps for mobile devices as well as websites. Examples include Quit Guide from the [employer] and smokefree.gov   What things can I do to make it easier to quit?  Reach out to your family and friends for support and encouragement. Call telephone quitlines (1-800-QUIT-NOW), reach out to support groups, or work with a counselor for support.  Ask people who smoke to avoid smoking around you.  Avoid places that trigger you to smoke, such as bars, parties, or smoke-break areas at work.  Spend time with people who do not smoke.  Lessen the stress in your life. Stress can be a smoking trigger for some people. To lessen stress, try: ? Exercising regularly. ? Doing deep-breathing exercises. ? Doing yoga. ? Meditating. ? Performing a body  scan. This involves closing your eyes, scanning your body from head to toe, and noticing which parts of your body are particularly tense. Try to relax the muscles in those areas.   How will I feel when I quit smoking? Day 1 to 3 weeks Within the first 24 hours of quitting smoking, you may start to feel withdrawal symptoms. These symptoms are usually most noticeable 2-3 days after quitting, but they usually do not last for more than 2-3 weeks. You may experience these symptoms:  Mood swings.  Restlessness, anxiety, or irritability.  Trouble concentrating.  Dizziness.  Strong cravings for sugary foods and nicotine.  Mild weight gain.  Constipation.  Nausea.  Coughing or a sore throat.  Changes in how the medicines that you take for unrelated issues work in your body.  Depression.  Trouble sleeping (insomnia). Week 3 and afterward After the first 2-3 weeks of quitting, you may start to notice more positive results, such as:  Improved sense of smell and taste.  Decreased coughing and sore throat.  Slower heart rate.  Lower blood pressure.  Clearer skin.  The ability to breathe more  easily.  Fewer sick days. Quitting smoking can be very challenging. Do not get discouraged if you are not successful the first time. Some people need to make many attempts to quit before they achieve long-term success. Do your best to stick to your quit plan, and talk with your health care provider if you have any questions or concerns. Summary  Smoking tobacco is the leading cause of preventable death. Quitting smoking is one of the best things that you can do for your health.  When you decide to quit smoking, create a plan to help you succeed.  Quit smoking right away, not slowly over a period of time.  When you start quitting, seek help from your health care provider, family, or friends. This information is not intended to replace advice given to you by your health care provider. Make  sure you discuss any questions you have with your health care provider. Document Revised: 06/14/2019 Document Reviewed: 12/08/2018 Elsevier Patient Education  2021 [employer].

## 2020-12-30 NOTE — Progress Notes (Signed)
Cardiology Office Note    Date:  12/31/2020   ID:  Jose Bright, DOB December 12, 1955, MRN [id number]  PCP:  Janeece Agee, NP  Cardiologist:   Thurmon Fair, MD   Chief Complaint  Patient presents with  . Coronary Artery Disease    History of Present Illness:  Jose Bright is a 65 y.o. male these requiring placement of a drug-eluting stent to the mid LAD artery in 2007, ongoing tobacco abuse, hyperlipidemia and hypertension.  Still doing well.  Also still smoking, although he does not smoke in his truck or in the house.  2 packs of cigarettes will last him 3 days.  He is developed a cough productive of thick secretions every morning, but he denies hemoptysis or wheezing.  He had a low risk nuclear stress test earlier this month.  Except for the presence of rare PACs, his ECG is completely normal.  He is not aware of the PACs.  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 Stress tests myocardial perfusion imaging in 2012, 2014, 2016 and 2020 showed normal findings, 2018 and 2022 studies showed inferoapical attenuation artifact he remains free of angina.  The last 2 stress tests have been performed with pharmacological stress, due to restrictions related to the coronavirus pandemic.    Past Medical History:  Diagnosis Date  . CAD (coronary artery disease)   . Hyperlipidemia   . Systemic hypertension   . Tobacco abuse     Past Surgical History:  Procedure Laterality Date  . CORONARY ANGIOPLASTY WITH STENT PLACEMENT  05/31/2006   drug eluting stent LAD  . Exercise Treadmill Test  02/04/2013   No significant ischemia  . Nuclear Stress Test  06/07/2011   No ischemia    Current Medications: Outpatient Medications Prior to Visit  Medication Sig Dispense Refill  . lisinopril (ZESTRIL) 20 MG tablet TAKE 1 TABLET BY MOUTH EVERY DAY 90 tablet 0  . atorvastatin (LIPITOR) 40 MG tablet TAKE 1 TABLET BY MOUTH EVERY DAY 90  tablet 3  . nitroGLYCERIN (NITROSTAT) 0.4 MG SL tablet Place 1 tablet (0.4 mg total) under the tongue every 5 (five) minutes as needed for chest pain. 25 tablet 1  . aspirin EC 81 MG tablet Take 81 mg by mouth daily. Patient takes randomly not daily     No facility-administered medications prior to visit.     Allergies:   Patient has no known allergies.   Social History   Socioeconomic History  . Marital status: Divorced    Spouse name: Not on file  . Number of children: Not on file  . Years of education: Not on file  . Highest education level: Not on file  Occupational History  . Not on file  Tobacco Use  . Smoking status: Current Every Day Smoker    Packs/day: 0.75    Years: 45.00    Pack years: 33.75    Types: Cigarettes  . Smokeless tobacco: Former Engineer, water and Sexual Activity  . Alcohol use: Yes    Alcohol/week: 7.0 - 14.0 standard drinks    Types: 7 - 14 Standard drinks or equivalent per week  . Drug use: No  . Sexual activity: Not on file  Other Topics Concern  . Not on file  Social History Narrative  . Not on file   Social Determinants of Health   Financial Resource Strain: Not on file  Food Insecurity: Not on file  Transportation Needs:  Not on file  Physical Activity: Not on file  Stress: Not on file  Social Connections: Not on file     Family History:  The patient's family history includes Heart attack in his father; Heart failure in his father; Hypertension in his father.   ROS:   Please see the history of present illness.    ROS All other systems are reviewed and are negative.   PHYSICAL EXAM:   VS:  BP 130/80   Pulse 70   Ht 5\' 10"  (1.778 m)   Wt 185 lb 3.2 oz (84 kg)   BMI 26.57 kg/m      General: Alert, oriented x3, no distress, mildly overweight Head: no evidence of trauma, PERRL, EOMI, no exophtalmos or lid lag, no myxedema, no xanthelasma; normal ears, nose and oropharynx Neck: normal jugular venous pulsations and no  hepatojugular reflux; brisk carotid pulses without delay and no carotid bruits Chest: clear to auscultation, no signs of consolidation by percussion or palpation, normal fremitus, symmetrical and full respiratory excursions Cardiovascular: normal position and quality of the apical impulse, regular rhythm, normal first and second heart sounds, no murmurs, rubs or gallops Abdomen: no tenderness or distention, no masses by palpation, no abnormal pulsatility or arterial bruits, normal bowel sounds, no hepatosplenomegaly Extremities: no clubbing, cyanosis or edema; 2+ radial, ulnar and brachial pulses bilaterally; 2+ right femoral, posterior tibial and dorsalis pedis pulses; 2+ left femoral, posterior tibial and dorsalis pedis pulses; no subclavian or femoral bruits Neurological: grossly nonfocal Psych: Normal mood and affect   Wt Readings from Last 3 Encounters:  12/30/20 185 lb 3.2 oz (84 kg)  12/01/20 194 lb (88 kg)  09/02/19 194 lb (88 kg)      Studies/Labs Reviewed:   EKG: ECGs ordered today and shows sinus rhythm with PACs, otherwise normal  Recent Labs: 06/18/2019 Creatinine 1.03, potassium 4.5, normal liver function tests, TSH 1.600 Total cholesterol 146, HDL 55, LDL 69, triglycerides 141 Lipid Panel    Component Value Date/Time   CHOL 146 12/30/2020 0940   TRIG 101 12/30/2020 0940   HDL 56 12/30/2020 0940   CHOLHDL 2.6 12/30/2020 0940   CHOLHDL 2.5 01/06/2017 0930   VLDL 11 01/06/2017 0930   LDLCALC 71 12/30/2020 0940    ASSESSMENT:    1. Coronary artery disease involving native coronary artery of native heart without angina pectoris   2. Hypercholesterolemia   3. Essential hypertension   4. Tobacco abuse   5. Simple chronic bronchitis (HCC)      PLAN:  In order of problems listed above:  1. CAD: Asymptomatic.  The focus remains on risk factor modification. DOT requires treadmill stress test every other year. 2. HLP: All lipid parameters in desirable range.  On  statin. 3. HTN: His blood pressure at home is in the 110-120/70 range.  He has kept a very detailed, daily log of his blood pressure.  It is only rarely out of range 4. Tobacco abuse: As I have done at all our previous meetings, I pointed out that smoking cessation is the single most important thing to avoid future coronary problems. 5. Chronic bronchitis: He has clear symptoms of chronic bronchitis.  He does not have wheezing.  I expect that he will be helped more by an anticholinergic, rather than a beta agonist inhaler and prescribed Spiriva today.   Medication Adjustments/Labs and Tests Ordered: Current medicines are reviewed at length with the patient today.  Concerns regarding medicines are outlined above.  Medication changes, Labs  and Tests ordered today are listed in the Patient Instructions below. Patient Instructions   Medication Instructions:  USE the [facility] inhaler once daily  *If you need a refill on your cardiac medications before your next appointment, please call your pharmacy*   Lab Work: Your provider would like for you to have the following labs today: lipid and BMET  If you have labs (blood work) drawn today and your tests are completely normal, you will receive your results only by: Marland Kitchen. MyChart Message (if you have MyChart) OR . A paper copy in the mail If you have any lab test that is abnormal or we need to change your treatment, we will call you to review the results.   Testing/Procedures: None ordered   Follow-Up: At [facility], you and your health needs are our priority.  As part of our continuing mission to provide you with exceptional heart care, we have created designated Provider Care Teams.  These Care Teams include your primary Cardiologist (physician) and Advanced Practice Providers (APPs -  Physician Assistants and Nurse Practitioners) who all work together to provide you with the care you need, when you need it.  We recommend signing up for the  patient portal called "MyChart".  Sign up information is provided on this After Visit Summary.  MyChart is used to connect with patients for Virtual Visits (Telemedicine).  Patients are able to view lab/test results, encounter notes, upcoming appointments, etc.  Non-urgent messages can be sent to your provider as well.   To learn more about what you can do with MyChart, go to [email or web address].    Your next appointment:   12 month(s)  The format for your next appointment:   In Person  Provider:   You may see Thurmon FairMihai Ta Fair, MD or one of the following Advanced Practice Providers on your designated Care Team:    Azalee CourseHao Meng, PA-C  Micah Flesherngela Duke, [place]-C or   Judy PimpleKrista Kroeger, PA-C   Steps to Quit Smoking Smoking tobacco is the leading cause of preventable death. It can affect almost every organ in the body. Smoking puts you and those around you at risk for developing many serious chronic diseases. Quitting smoking can be difficult, but it is one of the best things that you can do for your health. It is never too late to quit. How do I get ready to quit? When you decide to quit smoking, create a plan to help you succeed. Before you quit:  Pick a date to quit. Set a date within the next 2 weeks to give you time to prepare.  Write down the reasons why you are quitting. Keep this list in places where you will see it often.  Tell your family, friends, and co-workers that you are quitting. Support from your loved ones can make quitting easier.  Talk with your health care provider about your options for quitting smoking.  Find out what treatment options are covered by your health insurance.  Identify people, places, things, and activities that make you want to smoke (triggers). Avoid them. What first steps can I take to quit smoking?  Throw away all cigarettes at home, at work, and in your car.  Throw away smoking accessories, such as Set designerashtrays and lighters.  Clean your car. Make sure to  empty the ashtray.  Clean your home, including curtains and carpets. What strategies can I use to quit smoking? Talk with your health care provider about combining strategies, such as taking medicines while you are also receiving  in-person counseling. Using these two strategies together makes you more likely to succeed in quitting than if you used either strategy on its own.  If you are pregnant or breastfeeding, talk with your health care provider about finding counseling or other support strategies to quit smoking. Do not take medicine to help you quit smoking unless your health care provider tells you to do so. To quit smoking: Quit right away  Quit smoking completely, instead of gradually reducing how much you smoke over a period of time. Research shows that stopping smoking right away is more successful than gradually quitting.  Attend in-person counseling to help you build problem-solving skills. You are more likely to succeed in quitting if you attend counseling sessions regularly. Even short sessions of 10 minutes can be effective. Take medicine You may take medicines to help you quit smoking. Some medicines require a prescription and some you can purchase over-the-counter. Medicines may have nicotine in them to replace the nicotine in cigarettes. Medicines may:  Help to stop cravings.  Help to relieve withdrawal symptoms. Your health care provider may recommend:  Nicotine patches, gum, or lozenges.  Nicotine inhalers or sprays.  Non-nicotine medicine that is taken by mouth. Find resources Find resources and support systems that can help you to quit smoking and remain smoke-free after you quit. These resources are most helpful when you use them often. They include:  Online chats with a Veterinary surgeon.  Telephone quitlines.  Printed Materials engineer.  Support groups or group counseling.  Text messaging programs.  Mobile phone apps or applications. Use apps that can help  you stick to your quit plan by providing reminders, tips, and encouragement. There are many free apps for mobile devices as well as websites. Examples include Quit Guide from the [employer] and smokefree.gov   What things can I do to make it easier to quit?  Reach out to your family and friends for support and encouragement. Call telephone quitlines (1-800-QUIT-NOW), reach out to support groups, or work with a counselor for support.  Ask people who smoke to avoid smoking around you.  Avoid places that trigger you to smoke, such as bars, parties, or smoke-break areas at work.  Spend time with people who do not smoke.  Lessen the stress in your life. Stress can be a smoking trigger for some people. To lessen stress, try: ? Exercising regularly. ? Doing deep-breathing exercises. ? Doing yoga. ? Meditating. ? Performing a body scan. This involves closing your eyes, scanning your body from head to toe, and noticing which parts of your body are particularly tense. Try to relax the muscles in those areas.   How will I feel when I quit smoking? Day 1 to 3 weeks Within the first 24 hours of quitting smoking, you may start to feel withdrawal symptoms. These symptoms are usually most noticeable 2-3 days after quitting, but they usually do not last for more than 2-3 weeks. You may experience these symptoms:  Mood swings.  Restlessness, anxiety, or irritability.  Trouble concentrating.  Dizziness.  Strong cravings for sugary foods and nicotine.  Mild weight gain.  Constipation.  Nausea.  Coughing or a sore throat.  Changes in how the medicines that you take for unrelated issues work in your body.  Depression.  Trouble sleeping (insomnia). Week 3 and afterward After the first 2-3 weeks of quitting, you may start to notice more positive results, such as:  Improved sense of smell and taste.  Decreased coughing and sore throat.  Slower  heart rate.  Lower blood pressure.  Clearer  skin.  The ability to breathe more easily.  Fewer sick days. Quitting smoking can be very challenging. Do not get discouraged if you are not successful the first time. Some people need to make many attempts to quit before they achieve long-term success. Do your best to stick to your quit plan, and talk with your health care provider if you have any questions or concerns. Summary  Smoking tobacco is the leading cause of preventable death. Quitting smoking is one of the best things that you can do for your health.  When you decide to quit smoking, create a plan to help you succeed.  Quit smoking right away, not slowly over a period of time.  When you start quitting, seek help from your health care provider, family, or friends. This information is not intended to replace advice given to you by your health care provider. Make sure you discuss any questions you have with your health care provider. Document Revised: 06/14/2019 Document Reviewed: 12/08/2018 Elsevier Patient Education  [address].     Signed, Thurmon Fair, MD  12/31/2020 6:00 PM    [facility] [address] [place], [place], [place]  [address] Phone: [phone]; Fax: [phone]

## 2020-12-31 ENCOUNTER — Encounter: Payer: Self-pay | Admitting: *Deleted

## 2021-02-07 ENCOUNTER — Other Ambulatory Visit: Payer: Self-pay | Admitting: Cardiovascular Disease

## 2021-11-16 ENCOUNTER — Other Ambulatory Visit: Payer: Self-pay | Admitting: Cardiovascular Disease

## 2022-02-16 ENCOUNTER — Other Ambulatory Visit: Payer: Self-pay | Admitting: Cardiovascular Disease

## 2022-03-02 ENCOUNTER — Other Ambulatory Visit: Payer: Self-pay | Admitting: Cardiovascular Disease

## 2022-07-05 ENCOUNTER — Telehealth: Payer: Self-pay | Admitting: Cardiovascular Disease

## 2022-07-05 NOTE — Telephone Encounter (Signed)
Spoke with pt, aware his stress test can be ordered at his appointment 09/01/22.

## 2022-07-05 NOTE — Telephone Encounter (Signed)
Pt called to sch a stress test for next February , did not see an order yet.

## 2022-09-01 ENCOUNTER — Encounter: Payer: Self-pay | Admitting: Cardiovascular Disease

## 2022-09-01 ENCOUNTER — Ambulatory Visit: Payer: Medicare Other | Attending: Cardiovascular Disease | Admitting: Cardiovascular Disease

## 2022-09-01 VITALS — BP 136/74 | HR 67 | Ht 70.0 in | Wt 167.2 lb

## 2022-09-01 DIAGNOSIS — I2510 Atherosclerotic heart disease of native coronary artery without angina pectoris: Secondary | ICD-10-CM | POA: Insufficient documentation

## 2022-09-01 DIAGNOSIS — E7800 Pure hypercholesterolemia, unspecified: Secondary | ICD-10-CM | POA: Diagnosis not present

## 2022-09-01 DIAGNOSIS — I10 Essential (primary) hypertension: Secondary | ICD-10-CM | POA: Insufficient documentation

## 2022-09-01 DIAGNOSIS — Z720 Tobacco use: Secondary | ICD-10-CM | POA: Diagnosis not present

## 2022-09-01 DIAGNOSIS — J410 Simple chronic bronchitis: Secondary | ICD-10-CM | POA: Insufficient documentation

## 2022-09-01 DIAGNOSIS — J0111 Acute recurrent frontal sinusitis: Secondary | ICD-10-CM | POA: Insufficient documentation

## 2022-09-01 MED ORDER — PREDNISONE 20 MG PO TABS: 20.0000 mg | ORAL_TABLET | Freq: Two times a day (BID) | ORAL | 0 refills | Status: DC

## 2022-09-01 MED ORDER — CEFUROXIME AXETIL 250 MG PO TABS: 250.0000 mg | ORAL_TABLET | Freq: Two times a day (BID) | ORAL | 0 refills | Status: DC

## 2022-09-01 MED ORDER — NITROGLYCERIN 0.4 MG SL SUBL
0.4000 mg | SUBLINGUAL_TABLET | SUBLINGUAL | 2 refills | Status: DC | PRN
Start: 2022-09-01 — End: 2024-01-05

## 2022-09-01 MED ORDER — LISINOPRIL 20 MG PO TABS: 20.0000 mg | ORAL_TABLET | Freq: Every day | ORAL | 3 refills | Status: DC

## 2022-09-01 MED ORDER — ATORVASTATIN CALCIUM 40 MG PO TABS: 40.0000 mg | ORAL_TABLET | Freq: Every day | ORAL | 3 refills | Status: DC

## 2022-09-01 NOTE — Progress Notes (Signed)
Cardiology Office Note    Date:  09/01/2022   ID:  Jose Bright, DOB 04/06/1956, MRN [id number]  PCP:  Janeece Agee, NP  Cardiologist:   Thurmon Fair, MD   Chief Complaint  Patient presents with   Coronary Artery Disease    History of Present Illness:  Jose Bright is a 66 y.o. male these requiring placement of a drug-eluting stent to the mid LAD artery in 2007, ongoing tobacco abuse, hyperlipidemia and hypertension.  He is doing well from a cardiovascular point of view, but has bad sinus problems.  He has daily headaches that worsen if he lies down, lots of sinus drainage.  In the past he required several courses of antibiotics and steroids to get rid of the symptoms.  He has not had fever or chills and denies shortness of breath at rest.  He has not had any chest pain at rest or with activity, but does become short of breath with activity.  He has a chronic cough.  He denies lower extremity edema, claudication or focal neurological complaints.  He continues to smoke about a pack of cigarettes daily.  He was unable to fill the Spiriva due to cost.  He has occasional wheezing, but not frequently.  As before, his ECG is normal except for the presence of rare PACs.  He had a normal nuclear stress test in 2022.  He has lost weight.  He has cut back his lisinopril dosage to 10 mg daily.  Advised Blood pressure control.  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 Stress tests myocardial perfusion imaging in 2012, 2014, 2016 and 2020 showed normal findings, 2018 and 2022 studies showed inferoapical attenuation artifact he remains free of angina.  The last 2 stress tests have been performed with pharmacological stress, due to restrictions related to the coronavirus pandemic.   Past Medical History:  Diagnosis Date   CAD (coronary artery disease)    Hyperlipidemia    Systemic hypertension    Tobacco abuse     Past  Surgical History:  Procedure Laterality Date   CORONARY ANGIOPLASTY WITH STENT PLACEMENT  05/31/2006   drug eluting stent LAD   Exercise Treadmill Test  02/04/2013   No significant ischemia   Nuclear Stress Test  06/07/2011   No ischemia    Current Medications: Outpatient Medications Prior to Visit  Medication Sig Dispense Refill   atorvastatin (LIPITOR) 40 MG tablet TAKE 1 TABLET BY MOUTH DAILY. PATIENT MUST SCHEDULE APPOINTMENT FOR FUTURE REFILLS FIRST ATTEMPT 90 tablet 1   lisinopril (ZESTRIL) 20 MG tablet TAKE 1 TABLET BY MOUTH DAILY. PATIENT MUST SCHEDULE APPOINTMENT FOR FUTURE REFILLS FIRST ATTEMPT 30 tablet 0   nitroGLYCERIN (NITROSTAT) 0.4 MG SL tablet Place 1 tablet (0.4 mg total) under the tongue every 5 (five) minutes as needed for chest pain. (Patient not taking: Reported on 09/01/2022) 25 tablet 1   tiotropium (SPIRIVA HANDIHALER) 18 MCG inhalation capsule Place 1 capsule (18 mcg total) into inhaler and inhale daily. (Patient not taking: Reported on 09/01/2022) 30 capsule 1   No facility-administered medications prior to visit.     Allergies:   Patient has no known allergies.   Social History   Socioeconomic History   Marital status: Divorced    Spouse name: Not on file   Number of children: Not on file   Years of education: Not on file   Highest education level: Not on file  Occupational History  Not on file  Tobacco Use   Smoking status: Every Day    Packs/day: 0.75    Years: 45.00    Total pack years: 33.75    Types: Cigarettes   Smokeless tobacco: Former  Substance and Sexual Activity   Alcohol use: Yes    Alcohol/week: 7.0 - 14.0 standard drinks of alcohol    Types: 7 - 14 Standard drinks or equivalent per week   Drug use: No   Sexual activity: Not on file  Other Topics Concern   Not on file  Social History Narrative   Not on file   Social Determinants of Health   Financial Resource Strain: Not on file  Food Insecurity: Not on file  Transportation  Needs: Not on file  Physical Activity: Not on file  Stress: Not on file  Social Connections: Not on file     Family History:  The patient's family history includes Heart attack in his father; Heart failure in his father; Hypertension in his father.   ROS:   Please see the history of present illness.    ROS All other systems are reviewed and are negative.   PHYSICAL EXAM:   VS:  BP 136/74 (BP Location: Left Arm, Patient Position: Sitting, Cuff Size: Normal)   Pulse 67   Ht 5\' 10"  (1.778 m)   Wt 75.8 kg   SpO2 99%   BMI 23.99 kg/m       General: Alert, oriented x3, no distress Head: no evidence of trauma, PERRL, EOMI, no exophtalmos or lid lag, no myxedema, no xanthelasma; normal ears, nose and oropharynx.  Tender to palpation over his frontal sinuses. Neck: normal jugular venous pulsations and no hepatojugular reflux; brisk carotid pulses without delay and no carotid bruits Chest: clear to auscultation, no signs of consolidation by percussion or palpation, normal fremitus, symmetrical and full respiratory excursions Cardiovascular: normal position and quality of the apical impulse, regular rhythm, normal first and second heart sounds, no murmurs, rubs or gallops Abdomen: no tenderness or distention, no masses by palpation, no abnormal pulsatility or arterial bruits, normal bowel sounds, no hepatosplenomegaly Extremities: no clubbing, cyanosis or edema; 2+ radial, ulnar and brachial pulses bilaterally; 2+ right femoral, posterior tibial and dorsalis pedis pulses; 2+ left femoral, posterior tibial and dorsalis pedis pulses; no subclavian or femoral bruits Neurological: grossly nonfocal Psych: Normal mood and affect    Wt Readings from Last 3 Encounters:  09/01/22 75.8 kg  12/30/20 84 kg  12/01/20 88 kg      Studies/Labs Reviewed:   EKG: ECGs ordered today shows normal sinus rhythm with rare PACs. Recent Labs: 06/18/2019 Creatinine 1.03, potassium 4.5, normal liver  function tests, TSH 1.600 Total cholesterol 146, HDL 55, LDL 69, triglycerides 141 Lipid Panel    Component Value Date/Time   CHOL 146 12/30/2020 0940   TRIG 101 12/30/2020 0940   HDL 56 12/30/2020 0940   CHOLHDL 2.6 12/30/2020 0940   CHOLHDL 2.5 01/06/2017 0930   VLDL 11 01/06/2017 0930   LDLCALC 71 12/30/2020 0940    ASSESSMENT:    1. Coronary artery disease involving native coronary artery of native heart without angina pectoris   2. Hypercholesterolemia   3. Essential hypertension   4. Tobacco abuse   5. Simple chronic bronchitis (HCC)   6. Acute recurrent frontal sinusitis       PLAN:  In order of problems listed above:  CAD: Asymptomatic.  Scheduled for DOT stress test next year.  Continue aspirin, statin. HLP: Recheck  today. HTN: He is keeping a detailed record of his blood pressure and control is excellent on lisinopril 10 mg daily.  Continue at this dose. Tobacco abuse: Strongly recommended moping cessation. Chronic bronchitis: Occasional wheezing and chronic cough.  Unable to afford Spiriva.  Shortness of breath is relatively mild and only with physical activity.  Again recommend smoking cessation. Acute sinusitis: Gave him a prescription for antibiotics and if this is unsuccessful a short course of steroids.  Advised him to make sure he starts antibiotics first.  Previously had a CT scan for persistent headaches from sinusitis after 2 courses of antibiotics were unsuccessful, but this only showed evidence of sinusitis.  May need ENT evaluation.   Medication Adjustments/Labs and Tests Ordered: Current medicines are reviewed at length with the patient today.  Concerns regarding medicines are outlined above.  Medication changes, Labs and Tests ordered today are listed in the Patient Instructions below. Patient Instructions  Medication Instructions:  TAKE Cednif  *If you need a refill on your cardiac medications before your next appointment, please call your  pharmacy*   Lab Work: Your physician recommends that you return for lab work TODAY:  CBC CMP Lipid Panel  If you have labs (blood work) drawn today and your tests are completely normal, you will receive your results only by: MyChart Message (if you have MyChart) OR A paper copy in the mail If you have any lab test that is abnormal or we need to change your treatment, we will call you to review the results.   Testing/Procedures: Dr. Royann Shivers has ordered a Myocardial Perfusion Imaging Study.   The test will take approximately 3 to 4 hours to complete; you may bring reading material.  If someone comes with you to your appointment, they will need to remain in the main lobby due to limited space in the testing area. **If you are pregnant or breastfeeding, please notify the nuclear lab prior to your appointment**  You will need to hold the following medications prior to your stress test: beta-blockers (24 hours prior to test)   How to prepare for your Myocardial Perfusion Test: Do not eat or drink 3 hours prior to your test, except you may have water. Do not consume products containing caffeine (regular or decaffeinated) 12 hours prior to your test. (ex: coffee, chocolate, sodas, tea). Do wear comfortable clothes (no dresses or overalls) and walking shoes, tennis shoes preferred (No heels or open toe shoes are allowed). Do NOT wear cologne, perfume, aftershave, or lotions (deodorant is allowed). If these instructions are not followed, your test will have to be rescheduled.    Follow-Up: At [facility], you and your health needs are our priority.  As part of our continuing mission to provide you with exceptional heart care, we have created designated Provider Care Teams.  These Care Teams include your primary Cardiologist (physician) and Advanced Practice Providers (APPs -  Physician Assistants and Nurse Practitioners) who all work together to provide you with the care you need,  when you need it.  We recommend signing up for the patient portal called "MyChart".  Sign up information is provided on this After Visit Summary.  MyChart is used to connect with patients for Virtual Visits (Telemedicine).  Patients are able to view lab/test results, encounter notes, upcoming appointments, etc.  Non-urgent messages can be sent to your provider as well.   To learn more about what you can do with MyChart, go to [email or web address].    Your next appointment:  1 year(s)  The format for your next appointment:   In Person  Provider:   Thurmon Fair, MD     Other Instructions  Steps to Quit Smoking  Smoking tobacco is the leading cause of preventable death. It can affect almost every organ in the body. Smoking puts you and people around you at risk for many serious, long-lasting (chronic) diseases. Quitting smoking can be hard, but it is one of the best things that you can do for your health. It is never too late to quit. Do not give up if you cannot quit the first time. Some people need to try many times to quit. Do your best to stick to your quit plan, and talk with your doctor if you have any questions or concerns. How do I get ready to quit? Pick a date to quit. Set a date within the next 2 weeks to give you time to prepare. Write down the reasons why you are quitting. Keep this list in places where you will see it often. Tell your family, friends, and co-workers that you are quitting. Their support is important. Talk with your doctor about the choices that may help you quit. Find out if your health insurance will pay for these treatments. Know the people, places, things, and activities that make you want to smoke (triggers). Avoid them. What first steps can I take to quit smoking? Throw away all cigarettes at home, at work, and in your car. Throw away the things that you use when you smoke, such as ashtrays and lighters. Clean your car. Empty the ashtray. Clean  your home, including curtains and carpets. What can I do to help me quit smoking? Talk with your doctor about taking medicines and seeing a counselor. You are more likely to succeed when you do both. If you are pregnant or breastfeeding: Talk with your doctor about counseling or other ways to quit smoking. Do not take medicine to help you quit smoking unless your doctor tells you to. Quit right away Quit smoking completely, instead of slowly cutting back on how much you smoke over a period of time. Stopping smoking right away may be more successful than slowly quitting. Go to counseling. In-person is best if this is an option. You are more likely to quit if you go to counseling sessions regularly. Take medicine You may take medicines to help you quit. Some medicines need a prescription, and some you can buy over-the-counter. Some medicines may contain a drug called nicotine to replace the nicotine in cigarettes. Medicines may: Help you stop having the desire to smoke (cravings). Help to stop the problems that come when you stop smoking (withdrawal symptoms). Your doctor may ask you to use: Nicotine patches, gum, or lozenges. Nicotine inhalers or sprays. Non-nicotine medicine that you take by mouth. Find resources Find resources and other ways to help you quit smoking and remain smoke-free after you quit. They include: Online chats with a Veterinary surgeon. Phone quitlines. Printed Materials engineer. Support groups or group counseling. Text messaging programs. Mobile phone apps. Use apps on your mobile phone or tablet that can help you stick to your quit plan. Examples of free services include Quit Guide from the CDC and smokefree.gov  What can I do to make it easier to quit?  Talk to your family and friends. Ask them to support and encourage you. Call a phone quitline, such as 1-800-QUIT-NOW, reach out to support groups, or work with a Veterinary surgeon. Ask people who smoke to not  smoke around  you. Avoid places that make you want to smoke, such as: Bars. Parties. Smoke-break areas at work. Spend time with people who do not smoke. Lower the stress in your life. Stress can make you want to smoke. Try these things to lower stress: Getting regular exercise. Doing deep-breathing exercises. Doing yoga. Meditating. What benefits will I see if I quit smoking? Over time, you may have: A better sense of smell and taste. Less coughing and sore throat. A slower heart rate. Lower blood pressure. Clearer skin. Better breathing. Fewer sick days. Summary Quitting smoking can be hard, but it is one of the best things that you can do for your health. Do not give up if you cannot quit the first time. Some people need to try many times to quit. When you decide to quit smoking, make a plan to help you succeed. Quit smoking right away, not slowly over a period of time. When you start quitting, get help and support to keep you smoke-free. This information is not intended to replace advice given to you by your health care provider. Make sure you discuss any questions you have with your health care provider. Document Revised: 09/10/2021 Document Reviewed: 09/10/2021 Elsevier Patient Education  2023 Elsevier Inc.   Important Information About Sugar          Signed, Thurmon FairMihai Darlys Buis, MD  09/01/2022 9:38 AM    [facility] [address], [place], [place]  [address] Phone: [phone]; Fax: [phone]

## 2022-09-01 NOTE — Patient Instructions (Addendum)
Medication Instructions:  START Ceftin 250 mg 2 times a day for 10 days TAKE Prednisone 20 mg daily for 5 days. START this medication if the Antibiotic has not helped after 4-5 days of starting    *If you need a refill on your cardiac medications before your next appointment, please call your pharmacy*   Lab Work: Your physician recommends that you return for lab work TODAY:  CBC CMP Lipid Panel  If you have labs (blood work) drawn today and your tests are completely normal, you will receive your results only by: MyChart Message (if you have MyChart) OR A paper copy in the mail If you have any lab test that is abnormal or we need to change your treatment, we will call you to review the results.  Testing/Procedures: Dr. Sallyanne Kuster has ordered a Myocardial Perfusion Imaging Study.   The test will take approximately 3 to 4 hours to complete; you may bring reading material.  If someone comes with you to your appointment, they will need to remain in the main lobby due to limited space in the testing area. **If you are pregnant or breastfeeding, please notify the nuclear lab prior to your appointment**  You will need to hold the following medications prior to your stress test: beta-blockers (24 hours prior to test)   How to prepare for your Myocardial Perfusion Test: Do not eat or drink 3 hours prior to your test, except you may have water. Do not consume products containing caffeine (regular or decaffeinated) 12 hours prior to your test. (ex: coffee, chocolate, sodas, tea). Do wear comfortable clothes (no dresses or overalls) and walking shoes, tennis shoes preferred (No heels or open toe shoes are allowed). Do NOT wear cologne, perfume, aftershave, or lotions (deodorant is allowed). If these instructions are not followed, your test will have to be rescheduled.    Follow-Up: At [facility], you and your health needs are our priority.  As part of our continuing mission to provide  you with exceptional heart care, we have created designated Provider Care Teams.  These Care Teams include your primary Cardiologist (physician) and Advanced Practice Providers (APPs -  Physician Assistants and Nurse Practitioners) who all work together to provide you with the care you need, when you need it.  We recommend signing up for the patient portal called "MyChart".  Sign up information is provided on this After Visit Summary.  MyChart is used to connect with patients for Virtual Visits (Telemedicine).  Patients are able to view lab/test results, encounter notes, upcoming appointments, etc.  Non-urgent messages can be sent to your provider as well.   To learn more about what you can do with MyChart, go to [email or web address].    Your next appointment:   1 year(s)  The format for your next appointment:   In Person  Provider:   Sanda Klein, MD     Other Instructions  Steps to Quit Smoking  Smoking tobacco is the leading cause of preventable death. It can affect almost every organ in the body. Smoking puts you and people around you at risk for many serious, long-lasting (chronic) diseases. Quitting smoking can be hard, but it is one of the best things that you can do for your health. It is never too late to quit. Do not give up if you cannot quit the first time. Some people need to try many times to quit. Do your best to stick to your quit plan, and talk with your doctor if you  have any questions or concerns. How do I get ready to quit? Pick a date to quit. Set a date within the next 2 weeks to give you time to prepare. Write down the reasons why you are quitting. Keep this list in places where you will see it often. Tell your family, friends, and co-workers that you are quitting. Their support is important. Talk with your doctor about the choices that may help you quit. Find out if your health insurance will pay for these treatments. Know the people, places, things, and  activities that make you want to smoke (triggers). Avoid them. What first steps can I take to quit smoking? Throw away all cigarettes at home, at work, and in your car. Throw away the things that you use when you smoke, such as ashtrays and lighters. Clean your car. Empty the ashtray. Clean your home, including curtains and carpets. What can I do to help me quit smoking? Talk with your doctor about taking medicines and seeing a counselor. You are more likely to succeed when you do both. If you are pregnant or breastfeeding: Talk with your doctor about counseling or other ways to quit smoking. Do not take medicine to help you quit smoking unless your doctor tells you to. Quit right away Quit smoking completely, instead of slowly cutting back on how much you smoke over a period of time. Stopping smoking right away may be more successful than slowly quitting. Go to counseling. In-person is best if this is an option. You are more likely to quit if you go to counseling sessions regularly. Take medicine You may take medicines to help you quit. Some medicines need a prescription, and some you can buy over-the-counter. Some medicines may contain a drug called nicotine to replace the nicotine in cigarettes. Medicines may: Help you stop having the desire to smoke (cravings). Help to stop the problems that come when you stop smoking (withdrawal symptoms). Your doctor may ask you to use: Nicotine patches, gum, or lozenges. Nicotine inhalers or sprays. Non-nicotine medicine that you take by mouth. Find resources Find resources and other ways to help you quit smoking and remain smoke-free after you quit. They include: Online chats with a Veterinary surgeon. Phone quitlines. Printed Materials engineer. Support groups or group counseling. Text messaging programs. Mobile phone apps. Use apps on your mobile phone or tablet that can help you stick to your quit plan. Examples of free services include Quit Guide  from the CDC and smokefree.gov  What can I do to make it easier to quit?  Talk to your family and friends. Ask them to support and encourage you. Call a phone quitline, such as 1-800-QUIT-NOW, reach out to support groups, or work with a Veterinary surgeon. Ask people who smoke to not smoke around you. Avoid places that make you want to smoke, such as: Bars. Parties. Smoke-break areas at work. Spend time with people who do not smoke. Lower the stress in your life. Stress can make you want to smoke. Try these things to lower stress: Getting regular exercise. Doing deep-breathing exercises. Doing yoga. Meditating. What benefits will I see if I quit smoking? Over time, you may have: A better sense of smell and taste. Less coughing and sore throat. A slower heart rate. Lower blood pressure. Clearer skin. Better breathing. Fewer sick days. Summary Quitting smoking can be hard, but it is one of the best things that you can do for your health. Do not give up if you cannot quit the first time.  Some people need to try many times to quit. When you decide to quit smoking, make a plan to help you succeed. Quit smoking right away, not slowly over a period of time. When you start quitting, get help and support to keep you smoke-free. This information is not intended to replace advice given to you by your health care provider. Make sure you discuss any questions you have with your health care provider. Document Revised: 09/10/2021 Document Reviewed: 09/10/2021 Elsevier Patient Education  [place]

## 2022-09-02 LAB — LIPID PANEL
Chol/HDL Ratio: 2.6 ratio (ref 0.0–5.0)
Cholesterol, Total: 138 mg/dL (ref 100–199)
HDL: 53 mg/dL (ref >39)
LDL Chol Calc (NIH): 67 mg/dL (ref 0–99)
Triglycerides: 94 mg/dL (ref 0–149)
VLDL Cholesterol Cal: 18 mg/dL (ref 5–40)

## 2022-09-02 LAB — CBC
Hematocrit: 44.9 % (ref 37.5–51.0)
Hemoglobin: 14.7 g/dL (ref 13.0–17.7)
MCH: 31.7 pg (ref 26.6–33.0)
MCHC: 32.7 g/dL (ref 31.5–35.7)
MCV: 97 fL (ref 79–97)
Platelets: 292 10*3/uL (ref 150–450)
RBC: 4.63 x10E6/uL (ref 4.14–5.80)
RDW: 11.8 % (ref 11.6–15.4)
WBC: 6.1 10*3/uL (ref 3.4–10.8)

## 2022-09-02 LAB — COMPREHENSIVE METABOLIC PANEL
ALT: 18 IU/L (ref 0–44)
AST: 16 IU/L (ref 0–40)
Albumin/Globulin Ratio: 2.4 — ABNORMAL HIGH (ref 1.2–2.2)
Albumin: 4.6 g/dL (ref 3.9–4.9)
Alkaline Phosphatase: 62 IU/L (ref 44–121)
BUN/Creatinine Ratio: 8 — ABNORMAL LOW (ref 10–24)
BUN: 7 mg/dL — ABNORMAL LOW (ref 8–27)
Bilirubin Total: 0.7 mg/dL (ref 0.0–1.2)
CO2: 24 mmol/L (ref 20–29)
Calcium: 9.7 mg/dL (ref 8.6–10.2)
Chloride: 102 mmol/L (ref 96–106)
Creatinine, Ser: 0.85 mg/dL (ref 0.76–1.27)
Globulin, Total: 1.9 g/dL (ref 1.5–4.5)
Glucose: 103 mg/dL — ABNORMAL HIGH (ref 70–99)
Potassium: 4.4 mmol/L (ref 3.5–5.2)
Sodium: 139 mmol/L (ref 134–144)
Total Protein: 6.5 g/dL (ref 6.0–8.5)
eGFR: 95 mL/min/{1.73_m2} (ref >59)

## 2022-09-05 NOTE — Addendum Note (Signed)
Addended by: Derenda Fennel on: 09/05/2022 12:54 PM   Modules accepted: Orders

## 2022-09-09 ENCOUNTER — Other Ambulatory Visit: Payer: Self-pay | Admitting: Cardiovascular Disease

## 2022-09-09 DIAGNOSIS — J0111 Acute recurrent frontal sinusitis: Secondary | ICD-10-CM

## 2022-11-07 ENCOUNTER — Telehealth (HOSPITAL_COMMUNITY): Payer: Self-pay | Admitting: *Deleted

## 2022-11-07 NOTE — Telephone Encounter (Signed)
Left message on voicemail in reference to upcoming appointment scheduled for 11/09/22 Phone number given for a call back so details instructions can be given.  Kirstie Peri, RN

## 2022-11-09 ENCOUNTER — Ambulatory Visit (HOSPITAL_COMMUNITY): Payer: Medicare Other | Attending: Cardiovascular Disease

## 2022-11-09 DIAGNOSIS — I2510 Atherosclerotic heart disease of native coronary artery without angina pectoris: Secondary | ICD-10-CM | POA: Insufficient documentation

## 2022-11-09 LAB — MYOCARDIAL PERFUSION IMAGING
LV dias vol: 97 mL (ref 62–150)
LV sys vol: 42 mL
Nuc Stress EF: 57 %
Peak HR: 90 {beats}/min
Rest HR: 56 {beats}/min
Rest Nuclear Isotope Dose: 10.5 mCi
SDS: 3
SRS: 1
SSS: 4
ST Depression (mm): 0 mm
Stress Nuclear Isotope Dose: 32.9 mCi
TID: 1.17

## 2022-11-09 MED ORDER — TECHNETIUM TC 99M TETROFOSMIN IV KIT
32.9000 | PACK | Freq: Once | INTRAVENOUS | Status: AC | PRN
  Administered 2022-11-09: 32.9 via INTRAVENOUS

## 2022-11-09 MED ORDER — REGADENOSON 0.4 MG/5ML IV SOLN
0.4000 mg | Freq: Once | INTRAVENOUS | Status: AC
  Administered 2022-11-09: 0.4 mg via INTRAVENOUS

## 2022-11-09 MED ORDER — TECHNETIUM TC 99M TETROFOSMIN IV KIT
10.5000 | PACK | Freq: Once | INTRAVENOUS | Status: AC | PRN
  Administered 2022-11-09: 10.5 via INTRAVENOUS

## 2022-11-11 ENCOUNTER — Telehealth: Payer: Self-pay | Admitting: *Deleted

## 2022-11-11 NOTE — Telephone Encounter (Signed)
Called left message for patient to call back for results

## 2022-11-11 NOTE — Telephone Encounter (Signed)
Called patient back - obtain secure voicemail  Since patient previous call -left Complete result on voicemail .  any question may call back.

## 2022-11-11 NOTE — Telephone Encounter (Signed)
Patient returned call

## 2022-11-11 NOTE — Telephone Encounter (Signed)
Letter for DOT in epic.  Please give him a copy of his stress test as well.

## 2022-11-11 NOTE — Telephone Encounter (Signed)
Gave pt lexiscan results per Dr. Sallyanne Kuster: "There is no sign of any active severe coronary blockages that could cause a heart attack.  No reason to pursue cardiac catheterization at this point.  Heart pumping strength is normal.  I do not think performing a cardiac catheterization would be beneficial." Patient asked if he should do anything differently. Recommended to limit Na+, fats, and caffeine. Reviewed Mediterranean diet with pt.  Patient stated he need copy of the [place] with a letter for his DOT permit.

## 2022-11-11 NOTE — Telephone Encounter (Signed)
-----   Message from Sanda Klein, MD sent at 11/09/2022  6:43 PM EST ----- There is no sign of any active severe coronary blockages that could cause a heart attack.  No reason to pursue cardiac catheterization at this point.  Heart pumping strength is normal.  I do not think performing a cardiac catheterization would be beneficial.

## 2022-11-14 NOTE — Telephone Encounter (Signed)
Left message for pt to pick up DOT letter and Stress test report. Left at [place] desk.

## 2023-11-01 ENCOUNTER — Other Ambulatory Visit: Payer: Self-pay | Admitting: Cardiovascular Disease

## 2023-11-26 ENCOUNTER — Other Ambulatory Visit: Payer: Self-pay | Admitting: Cardiovascular Disease

## 2023-11-30 ENCOUNTER — Other Ambulatory Visit: Payer: Self-pay | Admitting: Cardiovascular Disease

## 2023-12-13 ENCOUNTER — Other Ambulatory Visit: Payer: Self-pay | Admitting: Cardiovascular Disease

## 2024-01-05 ENCOUNTER — Encounter: Payer: Self-pay | Admitting: Nurse Practitioner

## 2024-01-05 ENCOUNTER — Ambulatory Visit: Attending: Nurse Practitioner | Admitting: Nurse Practitioner

## 2024-01-05 VITALS — BP 126/74 | HR 60 | Ht 70.0 in | Wt 173.0 lb

## 2024-01-05 DIAGNOSIS — I10 Essential (primary) hypertension: Secondary | ICD-10-CM | POA: Insufficient documentation

## 2024-01-05 DIAGNOSIS — Z720 Tobacco use: Secondary | ICD-10-CM | POA: Insufficient documentation

## 2024-01-05 DIAGNOSIS — I2510 Atherosclerotic heart disease of native coronary artery without angina pectoris: Secondary | ICD-10-CM | POA: Insufficient documentation

## 2024-01-05 DIAGNOSIS — E785 Hyperlipidemia, unspecified: Secondary | ICD-10-CM | POA: Diagnosis present

## 2024-01-05 DIAGNOSIS — E7800 Pure hypercholesterolemia, unspecified: Secondary | ICD-10-CM

## 2024-01-05 DIAGNOSIS — J410 Simple chronic bronchitis: Secondary | ICD-10-CM | POA: Insufficient documentation

## 2024-01-05 MED ORDER — LISINOPRIL 20 MG PO TABS: 20.0000 mg | ORAL_TABLET | Freq: Every day | ORAL | 2 refills | Status: DC

## 2024-01-05 MED ORDER — ATORVASTATIN CALCIUM 40 MG PO TABS: 40.0000 mg | ORAL_TABLET | Freq: Every day | ORAL | 2 refills | Status: DC

## 2024-01-05 MED ORDER — NITROGLYCERIN 0.4 MG SL SUBL: 0.4000 mg | SUBLINGUAL_TABLET | SUBLINGUAL | 2 refills | Status: DC | PRN

## 2024-01-05 NOTE — Progress Notes (Signed)
 Office Visit    Patient Name: Jose Bright Date of Encounter: 01/05/2024  Primary Care Provider:  Jodelle Red, MD Primary Cardiologist:  Thurmon Fair, MD  Chief Complaint    68 year old male with a history of CAD s/p DES-LAD in 2007, hypertension, hyperlipidemia, chronic bronchitis, and tobacco use who presents for follow-up related to CAD.  Past Medical History    Past Medical History:  Diagnosis Date   CAD (coronary artery disease)    Hyperlipidemia    Systemic hypertension    Tobacco abuse    Past Surgical History:  Procedure Laterality Date   CORONARY ANGIOPLASTY WITH STENT PLACEMENT  05/31/2006   drug eluting stent LAD   Exercise Treadmill Test  02/04/2013   No significant ischemia   Nuclear Stress Test  06/07/2011   No ischemia    Allergies  No Known Allergies   Labs/Other Studies Reviewed    The following studies were reviewed today:  Cardiac Studies & Procedures   ______________________________________________________________________________________________   STRESS TESTS  MYOCARDIAL PERFUSION IMAGING 11/09/2022  Narrative   Findings are consistent with no ischemia. The study is low risk.   No ST deviation was noted.   LV perfusion is abnormal. Defect 1: There is a small defect with moderate reduction in uptake present in the apical to mid inferior location(s) that is fixed. There is normal wall motion in the defect area. Consistent with artifact.   Left ventricular function is normal. Nuclear stress EF: 57 %. The left ventricular ejection fraction is normal (55-65%). End diastolic cavity size is normal. End systolic cavity size is normal.   Prior study available for comparison from 12/01/2020.  The resting images have extracardiac activity and lower counts that stress images--suggests that most of the abnormalities seen at rest are artifact. Similar to prior, there is a focal area in the mid to apical inferior wall with reduced perfusion that is  similar at rest and stress. Not suggestive of ischemia. With normal wall motion in this area, likely artifact, but small infarct cannot be excluded. Overall low risk study without ischemia.            ______________________________________________________________________________________________     Recent Labs: No results found for requested labs within last 365 days.  Recent Lipid Panel    Component Value Date/Time   CHOL 138 09/01/2022 1004   TRIG 94 09/01/2022 1004   HDL 53 09/01/2022 1004   CHOLHDL 2.6 09/01/2022 1004   CHOLHDL 2.5 01/06/2017 0930   VLDL 11 01/06/2017 0930   LDLCALC 67 09/01/2022 1004    History of Present Illness    68 year old male with the above past medical history including CAD s/p DES-LAD in 2007, hypertension, hyperlipidemia, chronic bronchitis, and tobacco use.  He has had multiple ischemic evaluations since the time of his cardiac catheterization in 2007.  He is a [employer] and has required serial ischemic evaluations for DOT clearance. Stress test myocardial perfusion imaging in 2012, 2014, 2016, and 2021 showed normal findings, 2018 and 2020 consideration for continuation artifact. Most recent lexiscan myoview in February 2024 was negative for ischemia, low risk, EF 55 to 65%.  He was last seen in the office on 09/01/2022 and reported some shortness of breath, chronic cough, sinus congestion.  He was treated for a sinus infection.  He presents today for follow-up.  Since his last visit he has been stable from a cardiac standpoint.  He denies any symptoms concerning for angina.  He continues to smoke.  He notes  that he will require a stress test for DOT clearance in February of next year.  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 Overall, he reports feeling well.    Home Medications    Current Outpatient Medications  Medication Sig Dispense Refill   albuterol (VENTOLIN HFA) 108 (90 Base) MCG/ACT inhaler Inhale 2 puffs into the lungs.      atorvastatin (LIPITOR) 40 MG tablet TAKE 1 TABLET BY MOUTH EVERY DAY 15 tablet 0   lisinopril (ZESTRIL) 20 MG tablet TAKE 1 TABLET BY MOUTH EVERY DAY (Patient taking differently: Take 20 mg by mouth daily. Pt takes half a tablet daily.) 15 tablet 0   nitroGLYCERIN (NITROSTAT) 0.4 MG SL tablet Place 1 tablet (0.4 mg total) under the tongue every 5 (five) minutes as needed for chest pain. 25 tablet 2   No current facility-administered medications for this visit.     Review of Systems    He denies chest pain, palpitations, dyspnea, pnd, orthopnea, n, v, dizziness, syncope, edema, weight gain, or early satiety. All other systems reviewed and are otherwise negative except as noted above.   Physical Exam    VS:  BP 126/74 (BP Location: Left Arm, Patient Position: Sitting, Cuff Size: Normal)   Pulse 60   Ht 5\' 10"  (1.778 m)   Wt 173 lb (78.5 kg)   SpO2 93%   BMI 24.82 kg/m  GEN: Well nourished, well developed, in no acute distress. HEENT: normal. Neck: Supple, no JVD, carotid bruits, or masses. Cardiac: RRR, no murmurs, rubs, or gallops. No clubbing, cyanosis, edema.  Radials/DP/PT 2+ and equal bilaterally.  Respiratory:  Respirations regular and unlabored, clear to auscultation bilaterally. GI: Soft, nontender, nondistended, BS + x 4. MS: no deformity or atrophy. Skin: warm and dry, no rash. Neuro:  Strength and sensation are intact. Psych: Normal affect.  Accessory Clinical Findings    ECG personally reviewed by me today - EKG Interpretation Date/Time:  Friday January 05 2024 08:19:39 EDT Ventricular Rate:  60 PR Interval:  134 QRS Duration:  92 QT Interval:  418 QTC Calculation: 418 R Axis:   77  Text Interpretation: Normal sinus rhythm Normal ECG When compared with ECG of 01-Jun-2006 06:45, No significant change was found Confirmed by Bernadene Person ([id number]) on 01/05/2024 8:20:25 AM  - no acute changes.   Lab Results  Component Value Date   WBC 6.1 09/01/2022   HGB 14.7  09/01/2022   HCT 44.9 09/01/2022   MCV 97 09/01/2022   PLT 292 09/01/2022   Lab Results  Component Value Date   CREATININE 0.85 09/01/2022   BUN 7 (L) 09/01/2022   NA 139 09/01/2022   K 4.4 09/01/2022   CL 102 09/01/2022   CO2 24 09/01/2022   Lab Results  Component Value Date   ALT 18 09/01/2022   AST 16 09/01/2022   ALKPHOS 62 09/01/2022   BILITOT 0.7 09/01/2022   Lab Results  Component Value Date   CHOL 138 09/01/2022   HDL 53 09/01/2022   LDLCALC 67 09/01/2022   TRIG 94 09/01/2022   CHOLHDL 2.6 09/01/2022    Lab Results  Component Value Date   HGBA1C 5.2 06/18/2019    Assessment & Plan    1. CAD: S/p DES-LAD in 2007. Stable with no anginal symptoms. No indication for ischemic evaluation.  Continue lisinopril, Lipitor.  2. Hypertension: BP well controlled. Continue current antihypertensive regimen.   3. Hyperlipidemia: LDL was 67 in 08/2022. Will repeat fasting lipids,  CMET.  Continue Lipitor.  4. Chronic bronchitis/Tobacco use: Denies dyspnea, he continues to smoke.  He takes albuterol as needed.  5. Disposition: Follow-up in 11/2024, sooner if needed.      Joylene Grapes, NP 01/05/2024, 8:26 AM

## 2024-01-05 NOTE — Patient Instructions (Addendum)
 Medication Instructions:  NO CHANGES  Lab Work: FASTING LIPID PANEL AND CMET TO BE DONE WHEN FASTING.   Testing/Procedures: NONE  Follow-Up: At [facility], you and your health needs are our priority.  As part of our continuing mission to provide you with exceptional heart care, our providers are all part of one team.  This team includes your primary Cardiologist (physician) and Advanced Practice Providers or APPs (Physician Assistants and Nurse Practitioners) who all work together to provide you with the care you need, when you need it.  Your next appointment:   FEBRUARY 2026  Provider:   Thurmon Fair, MD     We recommend signing up for the patient portal called "MyChart".  Sign up information is provided on this After Visit Summary.  MyChart is used to connect with patients for Virtual Visits (Telemedicine).  Patients are able to view lab/test results, encounter notes, upcoming appointments, etc.  Non-urgent messages can be sent to your provider as well.   To learn more about what you can do with MyChart, go to [email or web address].   Other Instructions:      1st Floor: - Lobby - Registration  - Pharmacy  - Lab - Cafe  2nd Floor: - PV Lab - Diagnostic Testing (echo, CT, nuclear med)  3rd Floor: - Vacant  4th Floor: - TCTS (cardiothoracic surgery) - AFib Clinic - Structural Heart Clinic - Vascular Surgery  - Vascular Ultrasound  5th Floor: - [facility] (general and EP) - Clinical Pharmacy for coumadin, hypertension, lipid, weight-loss medications, and med management appointments    Valet parking services will be available as well.

## 2024-01-14 ENCOUNTER — Other Ambulatory Visit: Payer: Self-pay | Admitting: Nurse Practitioner

## 2024-03-14 ENCOUNTER — Other Ambulatory Visit: Payer: Self-pay

## 2024-03-14 ENCOUNTER — Telehealth: Payer: Self-pay

## 2024-03-14 DIAGNOSIS — E785 Hyperlipidemia, unspecified: Secondary | ICD-10-CM

## 2024-03-14 DIAGNOSIS — I2510 Atherosclerotic heart disease of native coronary artery without angina pectoris: Secondary | ICD-10-CM

## 2024-03-14 NOTE — Telephone Encounter (Signed)
 [facility] called to let us  know patient needed an order for CBC. Informed Lab that patient is suppose to get a CMET and Lipid when he is fasting per office note in April with Marlana Silvan NP. She stated they just got the Lipid panel and the patient is gone. Tried to call patient, and left message for him to call back. Will have patient get CMET at his convenience. Released order for CMET. Will make sure patient did not need an order of CBC.

## 2024-03-14 NOTE — Telephone Encounter (Signed)
 Spoke to patient advised when he came to lab today a lipid panel was done.Advised he will need to come back to have a cmet.Advised he can eat.He does not need a appointment.Stated he will have done next week.

## 2024-03-14 NOTE — Telephone Encounter (Signed)
 Patient is returning phone call. Patient would like more clarification as to why he is needing to have the labs completed. Patient is aware he may not receive a call until tomorrow. Please advise.

## 2024-03-15 ENCOUNTER — Ambulatory Visit: Payer: Self-pay | Admitting: Nurse Practitioner

## 2024-03-18 NOTE — Telephone Encounter (Signed)
Spoke with pt. Pt was notified of lab results. Pt will continue current medications and f/u as planned.

## 2024-08-04 ENCOUNTER — Other Ambulatory Visit: Payer: Self-pay

## 2024-08-04 ENCOUNTER — Ambulatory Visit: Admitting: Radiology

## 2024-08-04 ENCOUNTER — Ambulatory Visit
Admission: EM | Admit: 2024-08-04 | Discharge: 2024-08-04 | Disposition: A | Discharge status: Home / Self Care | Attending: Physician Assistant | Admitting: Physician Assistant

## 2024-08-04 ENCOUNTER — Other Ambulatory Visit: Payer: Self-pay | Admitting: Physician Assistant

## 2024-08-04 ENCOUNTER — Encounter: Payer: Self-pay | Admitting: Emergency Medicine

## 2024-08-04 DIAGNOSIS — L409 Psoriasis, unspecified: Secondary | ICD-10-CM

## 2024-08-04 DIAGNOSIS — R0689 Other abnormalities of breathing: Secondary | ICD-10-CM | POA: Diagnosis not present

## 2024-08-04 DIAGNOSIS — R0989 Other specified symptoms and signs involving the circulatory and respiratory systems: Secondary | ICD-10-CM | POA: Diagnosis not present

## 2024-08-04 DIAGNOSIS — R0602 Shortness of breath: Secondary | ICD-10-CM

## 2024-08-04 MED ORDER — PREDNISONE 20 MG PO TABS: ORAL_TABLET | ORAL | 0 refills | Status: DC

## 2024-08-04 MED ORDER — FLUTICASONE PROPIONATE HFA 110 MCG/ACT IN AERO: 2.0000 | INHALATION_SPRAY | Freq: Two times a day (BID) | RESPIRATORY_TRACT | 1 refills | Status: DC

## 2024-08-04 MED ORDER — IPRATROPIUM-ALBUTEROL 0.5-2.5 (3) MG/3ML IN SOLN
3.0000 mL | Freq: Once | RESPIRATORY_TRACT | Status: AC
  Administered 2024-08-04: 3 mL via RESPIRATORY_TRACT

## 2024-08-04 MED ORDER — ALBUTEROL SULFATE HFA 108 (90 BASE) MCG/ACT IN AERS: 1.0000 | INHALATION_SPRAY | Freq: Four times a day (QID) | RESPIRATORY_TRACT | 0 refills | Status: DC | PRN

## 2024-08-04 MED ORDER — AMOXICILLIN-POT CLAVULANATE 875-125 MG PO TABS: 1.0000 | ORAL_TABLET | Freq: Two times a day (BID) | ORAL | 0 refills | Status: DC

## 2024-08-04 NOTE — ED Triage Notes (Signed)
 Pt here for cough x 1 month with sinus pressure

## 2024-08-04 NOTE — Discharge Instructions (Addendum)
 VISIT SUMMARY:  You came in today with symptoms of a sinus and respiratory infection that have been ongoing for three to four weeks. You have been experiencing nasal congestion, sweating, and expelling green, yellow, and bloody mucus, along with significant post-nasal drainage leading to persistent coughing. You also reported shortness of breath and wheezing. You have a history of similar episodes, often triggered by environmental irritants. Additionally, you mentioned a persistent rash on the back of your neck, which you suspect might be psoriasis. You have significantly reduced your smoking due to difficulty breathing and persistent coughing and expressed a desire to quit smoking.  YOUR PLAN:  -ACUTE SINUSITIS WITH UPPER RESPIRATORY INFECTION: Acute sinusitis with an upper respiratory infection means that your sinuses and upper airways are inflamed, causing symptoms like nasal congestion, mucus production, and coughing. You have been prescribed Augmentin  875 mg to take orally twice daily for 7 days, a Flovent  inhaler to use twice daily, a prednisone  taper, and an albuterol  inhaler to use every 6 hours as needed for relief.  -SHORTNESS OF BREATH AND WHEEZING: Your shortness of breath and wheezing are likely due to your upper respiratory infection. You have been prescribed a Flovent  inhaler to use twice daily and an albuterol  inhaler for rescue use every 6 hours as needed.  -PSORIASIS OF THE NECK: Psoriasis is a chronic skin condition that causes red, scaly patches. For the psoriasis on your neck, you are advised to use thick ointments like Vaseline or Eucerin to keep the area moisturized.  -TOBACCO USE: Smoking can worsen your respiratory symptoms. You have been encouraged to quit smoking to help improve your breathing and overall health.  INSTRUCTIONS:  Please follow the prescribed medication regimen and use the inhalers as directed. If your symptoms do not improve or worsen, schedule a follow-up  appointment. Continue to work on quitting smoking, as it will significantly benefit your respiratory health. If you have any questions or concerns, do not hesitate to contact our office.

## 2024-08-04 NOTE — ED Provider Notes (Signed)
 [facility]    CSN: [id number] Arrival date & time: 08/04/24  1429      History   Chief Complaint Chief Complaint  Patient presents with   Cough    HPI Hassell Patras is a 68 y.o. male.  has a past medical history of CAD (coronary artery disease), Hyperlipidemia, Systemic hypertension, and Tobacco abuse.   HPI Discussed the use of AI scribe software for clinical note transcription with the patient, who gave verbal consent to proceed.  The patient presents with sinus and respiratory infection symptoms.  He has been experiencing sinus and respiratory infection symptoms for three to four weeks, including nasal congestion, sweating, and expelling green, yellow, and bloody mucus. Significant post-nasal drainage has led to persistent coughing. He has a history of similar episodes, with a previous occurrence in January after exposure to environmental irritants such as mowing tall grass and blowing leaves. He attributes these activities to the onset of his symptoms, acknowledging that not wearing a mask may have contributed to his condition.  No fever or chills are present, although he initially suspected a fever due to intense coughing. No nausea, vomiting, diarrhea, abdominal pain, body aches, or rashes, except for a persistent rash on the back of his neck, which he suspects might be psoriasis. He reports shortness of breath and wheezing.  He has been using nasal sprays and an expectorant to manage his symptoms but discontinued the expectorant due to excessive drainage. In January, he was prescribed an albuterol  inhaler, which he found helpful during similar symptoms. He mentions a previous prescription for Spiriva , which was not filled due to cost.  He has a history of smoking, which he has significantly reduced in the past few weeks due to difficulty breathing and persistent coughing. He wants to quit smoking, as he has been unable to smoke without exacerbating his  symptoms.   Past Medical History:  Diagnosis Date   CAD (coronary artery disease)    Hyperlipidemia    Systemic hypertension    Tobacco abuse     Patient Active Problem List   Diagnosis Date Noted   CAD (coronary artery disease) 02/28/2013   Tobacco abuse    Hyperlipidemia    Systemic hypertension     Past Surgical History:  Procedure Laterality Date   CORONARY ANGIOPLASTY WITH STENT PLACEMENT  05/31/2006   drug eluting stent LAD   Exercise Treadmill Test  02/04/2013   No significant ischemia   Nuclear Stress Test  06/07/2011   No ischemia       Home Medications    Prior to Admission medications   Medication Sig Start Date End Date Taking? Authorizing Provider  albuterol  (VENTOLIN  HFA) 108 (90 Base) MCG/ACT inhaler Inhale 1-2 puffs into the lungs every 6 (six) hours as needed for wheezing or shortness of breath. 08/04/24  Yes Holt Woolbright E, PA-C  amoxicillin -clavulanate (AUGMENTIN ) 875-125 MG tablet Take 1 tablet by mouth every 12 (twelve) hours. 08/04/24  Yes Karsen Nakanishi E, PA-C  fluticasone  (FLOVENT  HFA) 110 MCG/ACT inhaler Inhale 2 puffs into the lungs in the morning and at bedtime. 08/04/24  Yes Khrystyna Schwalm E, PA-C  predniSONE  (DELTASONE ) 20 MG tablet Take 60mg  PO daily x 2 days, then40mg  PO daily x 2 days, then 20mg  PO daily x 3 days 08/04/24  Yes Mccabe Gloria E, PA-C  albuterol  (VENTOLIN  HFA) 108 (90 Base) MCG/ACT inhaler Inhale 2 puffs into the lungs. 11/04/23   [provider]  atorvastatin  (LIPITOR) 40 MG tablet Take 1  tablet (40 mg total) by mouth daily. 01/05/24   Daneen Damien BROCKS, NP  lisinopril  (ZESTRIL ) 20 MG tablet Take 1 tablet (20 mg total) by mouth daily. 01/05/24   Daneen Damien BROCKS, NP  nitroGLYCERIN  (NITROSTAT ) 0.4 MG SL tablet PLACE 1 TABLET UNDER THE TONGUE EVERY 5 MINUTES AS NEEDED FOR CHEST PAIN. 01/16/24   Daneen Damien BROCKS, NP    Family History Family History  Problem Relation Age of Onset   Hypertension Father    Heart attack Father    Heart failure  Father     Social History Social History   Tobacco Use   Smoking status: Every Day    Current packs/day: 0.75    Average packs/day: 0.8 packs/day for 45.0 years (33.8 ttl pk-yrs)    Types: Cigarettes   Smokeless tobacco: Former  Substance Use Topics   Alcohol use: Yes    Alcohol/week: 7.0 - 14.0 standard drinks of alcohol    Types: 7 - 14 Standard drinks or equivalent per week   Drug use: No     Allergies   Patient has no known allergies.   Review of Systems Review of Systems  Constitutional:  Positive for fever (subjective). Negative for chills.  HENT:  Positive for congestion, postnasal drip, rhinorrhea and sore throat. Negative for ear pain.   Respiratory:  Positive for cough, shortness of breath and wheezing.   Gastrointestinal:  Negative for abdominal pain, diarrhea, nausea and vomiting.  Musculoskeletal:  Negative for myalgias.  Skin:  Positive for rash (around the back of the neck).  Neurological:  Negative for dizziness, light-headedness and headaches.     Physical Exam Triage Vital Signs ED Triage Vitals [08/04/24 1517]  Encounter Vitals Group     BP (!) 143/82     Girls Systolic BP Percentile      Girls Diastolic BP Percentile      Boys Systolic BP Percentile      Boys Diastolic BP Percentile      Pulse Rate 82     Resp 18     Temp 98 F (36.7 C)     Temp Source Oral     SpO2 98 %     Weight      Height      Head Circumference      Peak Flow      Pain Score 0     Pain Loc      Pain Education      Exclude from Growth Chart    No data found.  Updated Vital Signs BP (!) 143/82 (BP Location: Right Arm)   Pulse 82   Temp 98 F (36.7 C) (Oral)   Resp 18   SpO2 98%   Visual Acuity Right Eye Distance:   Left Eye Distance:   Bilateral Distance:    Right Eye Near:   Left Eye Near:    Bilateral Near:     Physical Exam Vitals reviewed.  Constitutional:      General: He is awake. He is not in acute distress.    Appearance: Normal  appearance. He is well-developed and well-groomed. He is not ill-appearing, toxic-appearing or diaphoretic.  HENT:     Head: Normocephalic and atraumatic.     Right Ear: Hearing, tympanic membrane and ear canal normal.     Left Ear: Hearing, tympanic membrane and ear canal normal.     Mouth/Throat:     Lips: Pink.     Mouth: Mucous membranes are moist.  Pharynx: Oropharynx is clear. Uvula midline. No pharyngeal swelling, oropharyngeal exudate, posterior oropharyngeal erythema, uvula swelling or postnasal drip.     Tonsils: No tonsillar exudate or tonsillar abscesses.  Eyes:     General: Lids are normal. Gaze aligned appropriately.     Extraocular Movements: Extraocular movements intact.  Cardiovascular:     Rate and Rhythm: Normal rate and regular rhythm.     Heart sounds: Normal heart sounds.  Pulmonary:     Effort: Pulmonary effort is normal. Prolonged expiration present. No tachypnea, bradypnea or accessory muscle usage.     Breath sounds: Decreased air movement present. Examination of the left-upper field reveals decreased breath sounds. Examination of the left-middle field reveals decreased breath sounds. Decreased breath sounds present. No wheezing, rhonchi or rales.  Musculoskeletal:     Cervical back: Normal range of motion and neck supple.  Lymphadenopathy:     Head:     Right side of head: No submental, submandibular or preauricular adenopathy.     Left side of head: No submental, submandibular or preauricular adenopathy.     Cervical:     Right cervical: No superficial cervical adenopathy.    Left cervical: No superficial cervical adenopathy.     Upper Body:     Right upper body: No supraclavicular adenopathy.     Left upper body: No supraclavicular adenopathy.  Skin:    General: Skin is warm and dry.  Neurological:     General: No focal deficit present.     Mental Status: He is alert and oriented to person, place, and time.  Psychiatric:        Mood and Affect:  Mood normal.        Behavior: Behavior normal. Behavior is cooperative.        Thought Content: Thought content normal.        Judgment: Judgment normal.      UC Treatments / Results  Labs (all labs ordered are listed, but only abnormal results are displayed) Labs Reviewed - No data to display  EKG   Radiology No results found.  Procedures Procedures (including critical care time)  Medications Ordered in UC Medications  ipratropium-albuterol  (DUONEB) 0.5-2.5 (3) MG/3ML nebulizer solution 3 mL (3 mLs Nebulization Given 08/04/24 1549)    Initial Impression / Assessment and Plan / UC Course  I have reviewed the triage vital signs and the nursing notes.  Pertinent labs & imaging results that were available during my care of the patient were reviewed by me and considered in my medical decision making (see chart for details).      Final Clinical Impressions(s) / UC Diagnoses   Final diagnoses:  SOB (shortness of breath)  Decreased lung sounds  Symptoms of upper respiratory infection (URI)  Psoriasis   Acute sinusitis with upper respiratory infection Symptoms include nasal congestion, green-yellow-bloody mucus, and post-nasal drip causing cough. No fever or chills. Symptoms persist for 3-4 weeks. Possible exacerbation due to environmental exposure. Chest x-ray shows no obvious signs of pneumonia on wet read- awaiting Radiology interpretation for final, if pneumonia is suspected recommend adding Zpack to regimen for CAP. - Prescribed Augmentin  875 mg orally twice daily for 7 days -Prescribe prednisone  taper  - Prescribed Flovent  inhaler twice daily - Prescribed albuterol  inhaler for rescue use every 6 hours as needed  Shortness of breath and wheezing Likely secondary to upper respiratory infection. No asthma or COPD diagnosis but I am suspicious for COPD given smoking hx and breathing in clinic. Smoking may  contribute to symptoms. Previous albuterol  inhaler provided  relief. - Prescribed Flovent  inhaler twice daily - Prescribed albuterol  inhaler for rescue use every 6 hours as needed  Psoriasis of the neck Chronic psoriasis on the neck, present for a couple of years. - Advised use of thick ointments like Vaseline or Eucerin for moisturizing  Tobacco use Reports smoking approximately half a dozen cigarettes in the last 3-4 weeks. Smoking may exacerbate respiratory symptoms. - Encouraged smoking cessation    Discharge Instructions      VISIT SUMMARY:  You came in today with symptoms of a sinus and respiratory infection that have been ongoing for three to four weeks. You have been experiencing nasal congestion, sweating, and expelling green, yellow, and bloody mucus, along with significant post-nasal drainage leading to persistent coughing. You also reported shortness of breath and wheezing. You have a history of similar episodes, often triggered by environmental irritants. Additionally, you mentioned a persistent rash on the back of your neck, which you suspect might be psoriasis. You have significantly reduced your smoking due to difficulty breathing and persistent coughing and expressed a desire to quit smoking.  YOUR PLAN:  -ACUTE SINUSITIS WITH UPPER RESPIRATORY INFECTION: Acute sinusitis with an upper respiratory infection means that your sinuses and upper airways are inflamed, causing symptoms like nasal congestion, mucus production, and coughing. You have been prescribed Augmentin  875 mg to take orally twice daily for 7 days, a Flovent  inhaler to use twice daily, a prednisone  taper, and an albuterol  inhaler to use every 6 hours as needed for relief.  -SHORTNESS OF BREATH AND WHEEZING: Your shortness of breath and wheezing are likely due to your upper respiratory infection. You have been prescribed a Flovent  inhaler to use twice daily and an albuterol  inhaler for rescue use every 6 hours as needed.  -PSORIASIS OF THE NECK: Psoriasis is a chronic  skin condition that causes red, scaly patches. For the psoriasis on your neck, you are advised to use thick ointments like Vaseline or Eucerin to keep the area moisturized.  -TOBACCO USE: Smoking can worsen your respiratory symptoms. You have been encouraged to quit smoking to help improve your breathing and overall health.  INSTRUCTIONS:  Please follow the prescribed medication regimen and use the inhalers as directed. If your symptoms do not improve or worsen, schedule a follow-up appointment. Continue to work on quitting smoking, as it will significantly benefit your respiratory health. If you have any questions or concerns, do not hesitate to contact our office.     ED Prescriptions     Medication Sig Dispense Auth. Provider   fluticasone  (FLOVENT  HFA) 110 MCG/ACT inhaler Inhale 2 puffs into the lungs in the morning and at bedtime. 12 g Kortlyn Koltz E, PA-C   predniSONE  (DELTASONE ) 20 MG tablet Take 60mg  PO daily x 2 days, then40mg  PO daily x 2 days, then 20mg  PO daily x 3 days 13 tablet Asli Tokarski E, PA-C   amoxicillin -clavulanate (AUGMENTIN ) 875-125 MG tablet Take 1 tablet by mouth every 12 (twelve) hours. 14 tablet Orra Nolde E, PA-C   albuterol  (VENTOLIN  HFA) 108 (90 Base) MCG/ACT inhaler Inhale 1-2 puffs into the lungs every 6 (six) hours as needed for wheezing or shortness of breath. 8 g Levii Hairfield E, PA-C      PDMP not reviewed this encounter.   Marylene Rocky BRAVO, PA-C 08/04/24 1617

## 2024-08-06 ENCOUNTER — Ambulatory Visit (HOSPITAL_COMMUNITY): Payer: Self-pay

## 2024-08-06 NOTE — Telephone Encounter (Signed)
 Requested Prescriptions  Refused Prescriptions Disp Refills   FLOVENT  HFA 110 MCG/ACT inhaler [Pharmacy Med Name: FLOVENT  HFA 110 MCG INHALER] 12 each 1    Sig: INHALE 2 PUFFS INTO THE LUNGS IN THE MORNING AND AT BEDTIME.     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

## 2024-08-07 NOTE — Progress Notes (Signed)
 Chest xray negative for pneumonia but given his hx I do recommend he follow up with PCP for annual lung cancer screening. Continue management plan as discussed

## 2024-08-09 NOTE — Telephone Encounter (Deleted)
 Pt returned call. Advised of results and recs. Questions answered. Verbalized understanding.  Pt also states fluticasone  was not covered by insurance. Will pay out of pocket if need be, but asked if alternative could be sent. Please advise. Will include PCP on thread.

## 2024-08-12 ENCOUNTER — Other Ambulatory Visit: Payer: Self-pay | Admitting: Physician Assistant

## 2024-08-12 MED ORDER — PULMICORT FLEXHALER 90 MCG/ACT IN AEPB: 1.0000 | INHALATION_SPRAY | Freq: Two times a day (BID) | RESPIRATORY_TRACT | 0 refills | Status: DC

## 2024-08-12 NOTE — Progress Notes (Signed)
 Pulmicort sent in as alternative. If there is still insurance coverage issues, he can try using a Goodrx and that should bring total down to about $48 when I searched for it today. The others are over $150 so hopefully that is more feasible for him.

## 2024-08-12 NOTE — Addendum Note (Signed)
 Addended by: Delsin Copen on: 08/12/2024 11:20 AM   Modules accepted: Orders

## 2024-08-13 ENCOUNTER — Telehealth: Payer: Self-pay | Admitting: Cardiovascular Disease

## 2024-08-13 DIAGNOSIS — I2510 Atherosclerotic heart disease of native coronary artery without angina pectoris: Secondary | ICD-10-CM

## 2024-08-13 NOTE — Telephone Encounter (Signed)
 OK to schedule and then f/u with me or Damien Braver after the test, please

## 2024-08-13 NOTE — Telephone Encounter (Signed)
 Pt requesting a c/b regarding an order for Stress Test. Pt states that per his employer he has to have test done before 11/03/2024. Pt would like a call when order has been out in. Please advise

## 2024-08-14 ENCOUNTER — Other Ambulatory Visit: Payer: Self-pay | Admitting: Physician Assistant

## 2024-08-14 NOTE — Telephone Encounter (Signed)
 Requested medication (s) are due for refill today: no  Requested medication (s) are on the active medication list: yes  Last refill:  08/12/24  Future visit scheduled: no  Notes to clinic:    Pharmacy comment: Alternative Requested:THE PRESCRIBED MEDICATION IS NOT COVERED BY INSURANCE. PLEASE CONSIDER CHANGING TO ONE OF THE SUGGESTED COVERED ALTERNATIVES.           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

## 2024-08-16 ENCOUNTER — Other Ambulatory Visit: Payer: Self-pay | Admitting: Physician Assistant

## 2024-08-16 NOTE — Telephone Encounter (Signed)
 Provider not at this practice Requested Prescriptions  Pending Prescriptions Disp Refills   Fluticasone  Furoate (ARNUITY ELLIPTA) 100 MCG/ACT AEPB [Pharmacy Med Name: ARNUITY ELLIPTA 100 MCG INH]  0     Pulmonology:  Corticosteroids Failed - 08/16/2024 10:39 AM      Failed - Valid encounter within last 12 months    Recent Outpatient Visits           4 years ago Lung nodule < 6cm on CT   [facility], Charlie, NP   5 years ago Diarrhea, unspecified type   [facility], Charlie, NP   6 years ago Testicular mass   Primary Care at [facility], [place], [place]   6 years 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

## 2024-08-16 NOTE — Telephone Encounter (Signed)
 Myocardial perfusion imaging w/lexiscan  stress test ordered   Dr Francyne has ordered a Lexiscan  Myocardial Perfusion Imaging Study.  Please arrive 15 minutes prior to your appointment time for registration and insurance purposes.   The test will take approximately 3 to 4 hours to complete; you may bring reading material.  If someone comes with you to your appointment, they will need to remain in the main lobby due to limited space in the testing area. **If you are pregnant or breastfeeding, please notify the nuclear lab prior to your appointment**   How to prepare for your Myocardial Perfusion Test: Do not eat or drink 3 hours prior to your test, except you may have water. Do not consume products containing caffeine (regular or decaffeinated) 12 hours prior to your test. (ex: coffee, chocolate, sodas, tea). Do wear comfortable clothes (no dresses or overalls) and walking shoes, tennis shoes preferred (No heels or open toe shoes are allowed). Do NOT wear cologne, perfume, aftershave, or lotions (deodorant is allowed). If you use an inhaler, use it the AM of your test and bring it with you.  If you use a nebulizer, use it the AM of your test.  If these instructions are not followed, your test will have to be rescheduled.    S/w the patient and went over the information above. He verbalized understanding.

## 2024-08-18 NOTE — Telephone Encounter (Signed)
 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

## 2024-09-05 ENCOUNTER — Telehealth (HOSPITAL_COMMUNITY): Payer: Self-pay | Admitting: *Deleted

## 2024-09-05 NOTE — Telephone Encounter (Signed)
 Left a message on voicemail as a reminder about a STRESS TEST on 09/13/24 at 10:15.

## 2024-09-10 ENCOUNTER — Other Ambulatory Visit: Payer: Self-pay | Admitting: Cardiovascular Disease

## 2024-09-10 DIAGNOSIS — I2510 Atherosclerotic heart disease of native coronary artery without angina pectoris: Secondary | ICD-10-CM

## 2024-09-13 ENCOUNTER — Ambulatory Visit: Payer: Self-pay | Admitting: Cardiovascular Disease

## 2024-09-13 ENCOUNTER — Ambulatory Visit (HOSPITAL_COMMUNITY)
Admission: RE | Admit: 2024-09-13 | Discharge: 2024-09-13 | Attending: Cardiovascular Disease | Admitting: Cardiovascular Disease

## 2024-09-13 DIAGNOSIS — I2510 Atherosclerotic heart disease of native coronary artery without angina pectoris: Secondary | ICD-10-CM

## 2024-09-13 LAB — MYOCARDIAL PERFUSION IMAGING
Base ST Depression (mm): 0 mm
LV dias vol: 78 mL (ref 62–150)
LV sys vol: 45 mL (ref 4.2–5.8)
Nuc Stress EF: 65 %
Peak HR: 95 {beats}/min
Rest HR: 76 {beats}/min
Rest Nuclear Isotope Dose: 10.7 mCi
SDS: 0
SRS: 3
SSS: 1
ST Depression (mm): 0 mm
Stress Nuclear Isotope Dose: 31.8 mCi
TID: 0.96

## 2024-09-13 MED ORDER — REGADENOSON 0.4 MG/5ML IV SOLN
INTRAVENOUS | Status: AC
  Filled 2024-09-13: qty 5

## 2024-09-13 MED ORDER — REGADENOSON 0.4 MG/5ML IV SOLN
0.4000 mg | Freq: Once | INTRAVENOUS | Status: AC
  Administered 2024-09-13: 0.4 mg via INTRAVENOUS

## 2024-09-13 MED ORDER — TECHNETIUM TC 99M TETROFOSMIN IV KIT
31.8000 | PACK | Freq: Once | INTRAVENOUS | Status: AC | PRN
  Administered 2024-09-13: 31.8 via INTRAVENOUS

## 2024-09-13 MED ORDER — TECHNETIUM TC 99M TETROFOSMIN IV KIT
10.7000 | PACK | Freq: Once | INTRAVENOUS | Status: AC | PRN
  Administered 2024-09-13: 10.7 via INTRAVENOUS

## 2024-09-13 NOTE — Telephone Encounter (Signed)
 Left message and mailed results to patient.

## 2024-09-16 ENCOUNTER — Ambulatory Visit: Payer: Self-pay | Admitting: Cardiovascular Disease

## 2024-09-18 NOTE — Telephone Encounter (Signed)
 Left message with the results and call back number.

## 2024-10-01 ENCOUNTER — Ambulatory Visit: Admitting: Cardiovascular Disease

## 2024-10-01 ENCOUNTER — Encounter: Payer: Self-pay | Admitting: Cardiovascular Disease

## 2024-10-01 ENCOUNTER — Other Ambulatory Visit: Payer: Self-pay | Admitting: Physician Assistant

## 2024-10-01 VITALS — BP 120/70 | HR 64 | Ht 70.0 in | Wt 167.0 lb

## 2024-10-01 DIAGNOSIS — F17200 Nicotine dependence, unspecified, uncomplicated: Secondary | ICD-10-CM | POA: Insufficient documentation

## 2024-10-01 DIAGNOSIS — I10 Essential (primary) hypertension: Secondary | ICD-10-CM | POA: Insufficient documentation

## 2024-10-01 DIAGNOSIS — E785 Hyperlipidemia, unspecified: Secondary | ICD-10-CM | POA: Diagnosis not present

## 2024-10-01 DIAGNOSIS — J410 Simple chronic bronchitis: Secondary | ICD-10-CM | POA: Insufficient documentation

## 2024-10-01 DIAGNOSIS — Z79899 Other long term (current) drug therapy: Secondary | ICD-10-CM | POA: Diagnosis present

## 2024-10-01 DIAGNOSIS — I2510 Atherosclerotic heart disease of native coronary artery without angina pectoris: Secondary | ICD-10-CM | POA: Diagnosis not present

## 2024-10-01 MED ORDER — PULMICORT FLEXHALER 90 MCG/ACT IN AEPB: 1.0000 | INHALATION_SPRAY | Freq: Two times a day (BID) | RESPIRATORY_TRACT | 0 refills | Status: AC

## 2024-10-01 MED ORDER — ALBUTEROL SULFATE HFA 108 (90 BASE) MCG/ACT IN AERS: 1.0000 | INHALATION_SPRAY | Freq: Four times a day (QID) | RESPIRATORY_TRACT | 0 refills | Status: AC | PRN

## 2024-10-01 NOTE — Patient Instructions (Signed)
 Medication Instructions:  No changes *If you need a refill on your cardiac medications before your next appointment, please call your pharmacy*  Lab Work: Lipid panel and CMP If you have labs (blood work) drawn today and your tests are completely normal, you will receive your results only by: MyChart Message (if you have MyChart) OR A paper copy in the mail If you have any lab test that is abnormal or we need to change your treatment, we will call you to review the results.  Testing/Procedures: None ordered  Follow-Up: At [facility], you and your health needs are our priority.  As part of our continuing mission to provide you with exceptional heart care, our providers are all part of one team.  This team includes your primary Cardiologist (physician) and Advanced Practice Providers or APPs (Physician Assistants and Nurse Practitioners) who all work together to provide you with the care you need, when you need it.  Your next appointment:   1 year(s)  Provider:   Jerel Balding, MD    We recommend signing up for the patient portal called MyChart.  Sign up information is provided on this After Visit Summary.  MyChart is used to connect with patients for Virtual Visits (Telemedicine).  Patients are able to view lab/test results, encounter notes, upcoming appointments, etc.  Non-urgent messages can be sent to your provider as well.   To learn more about what you can do with MyChart, go to [email or web address].

## 2024-10-01 NOTE — Progress Notes (Signed)
 "   Cardiology Office Note    Date:  10/05/2024   ID:  Jose Bright, DOB 1956-05-09, MRN [id number]  PCP:  Patient, No Pcp Per  Cardiologist:   Jerel Balding, MD   Chief Complaint  Patient presents with   Coronary Artery Disease    History of Present Illness:  Jose Bright is a 68 y.o. male these requiring placement of a drug-eluting stent to the mid LAD artery in 2007, ongoing tobacco abuse, hyperlipidemia and hypertension.  The patient specifically denies any chest pain at rest or with exertion, dyspnea at rest or with exertion, orthopnea, paroxysmal nocturnal dyspnea, syncope, palpitations, focal neurological deficits, intermittent claudication, lower extremity edema, unexplained weight gain.   He had a bad respiratory infection in November.  He has a chronic cough, but denies hemoptysis and only has occasional wheezing.  He is smoking less. A pack will last him as much as 2 weeks.  As before, his ECG is normal (previously rare PACs, none today).  He had a normal nuclear stress test 09/13/2024.  He has lost weight.  He has cut back his lisinopril  dosage to 10 mg daily.  Advised Blood pressure control.  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 Stress tests myocardial perfusion imaging in 2012, 2014, 2016 and 2020 showed normal findings, 2018 and 2022 and 2025 studies showed inferoapical attenuation artifact.   Past Medical History:  Diagnosis Date   CAD (coronary artery disease)    Hyperlipidemia    Systemic hypertension    Tobacco abuse     Past Surgical History:  Procedure Laterality Date   CORONARY ANGIOPLASTY WITH STENT PLACEMENT  05/31/2006   drug eluting stent LAD   Exercise Treadmill Test  02/04/2013   No significant ischemia   Nuclear Stress Test  06/07/2011   No ischemia    Current Medications: Outpatient Medications Prior to Visit  Medication Sig Dispense Refill   atorvastatin  (LIPITOR) 40 MG  tablet Take 1 tablet (40 mg total) by mouth daily. 90 tablet 2   lisinopril  (ZESTRIL ) 20 MG tablet Take 1 tablet (20 mg total) by mouth daily. 90 tablet 2   nitroGLYCERIN  (NITROSTAT ) 0.4 MG SL tablet PLACE 1 TABLET UNDER THE TONGUE EVERY 5 MINUTES AS NEEDED FOR CHEST PAIN. 75 tablet 3   albuterol  (VENTOLIN  HFA) 108 (90 Base) MCG/ACT inhaler Inhale 2 puffs into the lungs. (Patient not taking: Reported on 10/01/2024)     albuterol  (VENTOLIN  HFA) 108 (90 Base) MCG/ACT inhaler Inhale 1-2 puffs into the lungs every 6 (six) hours as needed for wheezing or shortness of breath. (Patient not taking: Reported on 10/01/2024) 8 g 0   amoxicillin -clavulanate (AUGMENTIN ) 875-125 MG tablet Take 1 tablet by mouth every 12 (twelve) hours. (Patient not taking: Reported on 10/01/2024) 14 tablet 0   Budesonide  (PULMICORT  FLEXHALER) 90 MCG/ACT inhaler Inhale 1-2 puffs into the lungs 2 (two) times daily. (Patient not taking: Reported on 10/01/2024) 1 each 0   predniSONE  (DELTASONE ) 20 MG tablet Take 60mg  PO daily x 2 days, then40mg  PO daily x 2 days, then 20mg  PO daily x 3 days (Patient not taking: Reported on 10/01/2024) 13 tablet 0   No facility-administered medications prior to visit.     Allergies:   Patient has no known allergies.   Social History   Socioeconomic History   Marital status: Divorced    Spouse name: Not on file   Number of children: Not on file  Years of education: Not on file   Highest education level: Not on file  Occupational History   Not on file  Tobacco Use   Smoking status: Some Days    Current packs/day: 0.75    Average packs/day: 0.8 packs/day for 45.0 years (33.8 ttl pk-yrs)    Types: Cigarettes   Smokeless tobacco: Former  Substance and Sexual Activity   Alcohol use: Yes    Alcohol/week: 7.0 - 14.0 standard drinks of alcohol    Types: 7 - 14 Standard drinks or equivalent per week   Drug use: No   Sexual activity: Not on file  Other Topics Concern   Not on file  Social  History Narrative   Not on file   Social Drivers of Health   Tobacco Use: High Risk (10/01/2024)   Patient History    Smoking Tobacco Use: Some Days    Smokeless Tobacco Use: Former    Passive Exposure: Not on Actuary Strain: Not on file  Food Insecurity: Not on file  Transportation Needs: Not on file  Physical Activity: Not on file  Stress: Not on file  Social Connections: Not on file  Depression (PHQ2-9): Not on file  Alcohol Screen: Not on file  Housing: Not on file  Utilities: Not on file  Health Literacy: Not on file     Family History:  The patient's family history includes Heart attack in his father; Heart failure in his father; Hypertension in his father.   ROS:   Please see the history of present illness.    ROS All other systems are reviewed and are negative.   PHYSICAL EXAM:   VS:  BP 120/70   Pulse 64   Ht 5' 10 (1.778 m)   Wt 167 lb (75.8 kg)   SpO2 96%   BMI 23.96 kg/m      General: Alert, oriented x3, no distress, lean Head: no evidence of trauma, PERRL, EOMI, no exophtalmos or lid lag, no myxedema, no xanthelasma; normal ears, nose and oropharynx Neck: normal jugular venous pulsations and no hepatojugular reflux; brisk carotid pulses without delay and no carotid bruits Chest: clear to auscultation, no signs of consolidation by percussion or palpation, normal fremitus, symmetrical and full respiratory excursions Cardiovascular: normal position and quality of the apical impulse, regular rhythm, normal first and second heart sounds, no murmurs, rubs or gallops Abdomen: no tenderness or distention, no masses by palpation, no abnormal pulsatility or arterial bruits, normal bowel sounds, no hepatosplenomegaly Extremities: no clubbing, cyanosis or edema; 2+ radial, ulnar and brachial pulses bilaterally; 2+ right femoral, posterior tibial and dorsalis pedis pulses; 2+ left femoral, posterior tibial and dorsalis pedis pulses; no subclavian or  femoral bruits Neurological: grossly nonfocal Psych: Normal mood and affect     Wt Readings from Last 3 Encounters:  10/01/24 167 lb (75.8 kg)  01/05/24 173 lb (78.5 kg)  11/09/22 167 lb (75.8 kg)      Studies/Labs Reviewed:   EKG:   EKG Interpretation Date/Time:  Tuesday October 01 2024 14:30:17 EST Ventricular Rate:  64 PR Interval:  122 QRS Duration:  88 QT Interval:  402 QTC Calculation: 414 R Axis:   87  Text Interpretation: Normal sinus rhythm Normal ECG When compared with ECG of 05-Jan-2024 08:19, No significant change was found Confirmed by Reynolds Kittel ([id number]) on 10/01/2024 2:37:15 PM        Recent Labs: 06/18/2019 Creatinine 1.03, potassium 4.5, normal liver function tests, TSH 1.600 Total cholesterol 146, HDL  55, LDL 69, triglycerides 141 Lipid Panel    Component Value Date/Time   CHOL 136 10/01/2024 1521   TRIG 73 10/01/2024 1521   HDL 74 10/01/2024 1521   CHOLHDL 1.8 10/01/2024 1521   CHOLHDL 2.5 01/06/2017 0930   VLDL 11 01/06/2017 0930   LDLCALC 47 10/01/2024 1521      Latest Ref Rng & Units 10/01/2024    3:21 PM 09/01/2022   10:04 AM 12/30/2020    9:40 AM  BMP  Glucose 70 - 99 mg/dL 98  896  899   BUN 8 - 27 mg/dL 12  7  7    Creatinine 0.76 - 1.27 mg/dL 8.89  9.14  9.05   BUN/Creat Ratio 10 - 24 11  8  7    Sodium 134 - 144 mmol/L 140  139  139   Potassium 3.5 - 5.2 mmol/L 4.5  4.4  4.6   Chloride 96 - 106 mmol/L 102  102  99   CO2 20 - 29 mmol/L 23  24  23    Calcium  8.6 - 10.2 mg/dL 9.4  9.7  9.1      ASSESSMENT:    1. Coronary artery disease involving native coronary artery of native heart without angina pectoris   2. Hyperlipidemia LDL goal <70   3. Essential hypertension   4. Medication management   5. Simple chronic bronchitis (HCC)   6. Thinking about stopping smoking       PLAN:  In order of problems listed above:  CAD: Asymptomatic. Continue ASA and statin. Low risk nuclear stress test (required by DOT every 2  years) HLP: All parameters in target range, continue statin. HTN: Well controlled, continue lisinopril  Tobacco abuse: encouraged by a marked reduction in his smoking. Recommended complete cessation. Chronic bronchitis: Unable to afford combination inhalers. Described the different purposes of steroid inhaler (maintenance) and albuterol  (rescue).    Medication Adjustments/Labs and Tests Ordered: Current medicines are reviewed at length with the patient today.  Concerns regarding medicines are outlined above.  Medication changes, Labs and Tests ordered today are listed in the Patient Instructions below. Patient Instructions  Medication Instructions:  No changes *If you need a refill on your cardiac medications before your next appointment, please call your pharmacy*  Lab Work: Lipid panel and CMP If you have labs (blood work) drawn today and your tests are completely normal, you will receive your results only by: MyChart Message (if you have MyChart) OR A paper copy in the mail If you have any lab test that is abnormal or we need to change your treatment, we will call you to review the results.  Testing/Procedures: None ordered  Follow-Up: At [facility], you and your health needs are our priority.  As part of our continuing mission to provide you with exceptional heart care, our providers are all part of one team.  This team includes your primary Cardiologist (physician) and Advanced Practice Providers or APPs (Physician Assistants and Nurse Practitioners) who all work together to provide you with the care you need, when you need it.  Your next appointment:   1 year(s)  Provider:   Jerel Balding, MD    We recommend signing up for the patient portal called MyChart.  Sign up information is provided on this After Visit Summary.  MyChart is used to connect with patients for Virtual Visits (Telemedicine).  Patients are able to view lab/test results, encounter notes, upcoming  appointments, etc.  Non-urgent messages can be sent to your  provider as well.   To learn more about what you can do with MyChart, go to [email or web address].         Signed, Jerel Balding, MD  10/05/2024 1:33 PM    [facility] [address] [place], [place], [place]  [address] Phone: [phone]; Fax: [phone]   "

## 2024-10-02 ENCOUNTER — Other Ambulatory Visit: Payer: Self-pay | Admitting: Physician Assistant

## 2024-10-02 ENCOUNTER — Ambulatory Visit: Payer: Self-pay | Admitting: Cardiovascular Disease

## 2024-10-02 LAB — COMPREHENSIVE METABOLIC PANEL WITH GFR
ALT: 22 IU/L (ref 0–44)
AST: 23 IU/L (ref 0–40)
Albumin: 4.3 g/dL (ref 3.9–4.9)
Alkaline Phosphatase: 73 IU/L (ref 47–123)
BUN/Creatinine Ratio: 11 (ref 10–24)
BUN: 12 mg/dL (ref 8–27)
Bilirubin Total: 0.9 mg/dL (ref 0.0–1.2)
CO2: 23 mmol/L (ref 20–29)
Calcium: 9.4 mg/dL (ref 8.6–10.2)
Chloride: 102 mmol/L (ref 96–106)
Creatinine, Ser: 1.1 mg/dL (ref 0.76–1.27)
Globulin, Total: 2.3 g/dL (ref 1.5–4.5)
Glucose: 98 mg/dL (ref 70–99)
Potassium: 4.5 mmol/L (ref 3.5–5.2)
Sodium: 140 mmol/L (ref 134–144)
Total Protein: 6.6 g/dL (ref 6.0–8.5)
eGFR: 73 mL/min/1.73

## 2024-10-02 LAB — LIPID PANEL
Chol/HDL Ratio: 1.8 ratio (ref 0.0–5.0)
Cholesterol, Total: 136 mg/dL (ref 100–199)
HDL: 74 mg/dL
LDL Chol Calc (NIH): 47 mg/dL (ref 0–99)
Triglycerides: 73 mg/dL (ref 0–149)
VLDL Cholesterol Cal: 15 mg/dL (ref 5–40)

## 2024-10-02 NOTE — Telephone Encounter (Signed)
 No longer under prescriber care, refusing this request for Erin Mecum to refill.   Requested Prescriptions  Pending Prescriptions Disp Refills   Fluticasone  Furoate (ARNUITY ELLIPTA) 100 MCG/ACT AEPB [Pharmacy Med Name: ARNUITY ELLIPTA 100 MCG INH]  0     There is no refill protocol information for this order

## 2024-10-02 NOTE — Telephone Encounter (Signed)
 Sent to wrong office, med refilled by cardiology 10/01/24, refusing this request.

## 2024-10-29 ENCOUNTER — Other Ambulatory Visit: Payer: Self-pay | Admitting: Nurse Practitioner
